# Patient Record
Sex: Male | Born: 1955 | Race: White | Hispanic: No | Marital: Single | Smoking: Former smoker
Health system: Southern US, Community
[De-identification: ages and names within clinical notes are randomized; demographics above are authoritative.]

## PROBLEM LIST (undated history)

## (undated) DIAGNOSIS — I639 Cerebral infarction, unspecified: Secondary | ICD-10-CM

## (undated) DIAGNOSIS — I10 Essential (primary) hypertension: Secondary | ICD-10-CM

## (undated) DIAGNOSIS — I447 Left bundle-branch block, unspecified: Secondary | ICD-10-CM

## (undated) DIAGNOSIS — I509 Heart failure, unspecified: Secondary | ICD-10-CM

## (undated) DIAGNOSIS — E785 Hyperlipidemia, unspecified: Secondary | ICD-10-CM

## (undated) DIAGNOSIS — E119 Type 2 diabetes mellitus without complications: Secondary | ICD-10-CM

## (undated) DIAGNOSIS — J984 Other disorders of lung: Secondary | ICD-10-CM

## (undated) DIAGNOSIS — I2510 Atherosclerotic heart disease of native coronary artery without angina pectoris: Secondary | ICD-10-CM

## (undated) DIAGNOSIS — E1140 Type 2 diabetes mellitus with diabetic neuropathy, unspecified: Secondary | ICD-10-CM

## (undated) DIAGNOSIS — Z86711 Personal history of pulmonary embolism: Secondary | ICD-10-CM

## (undated) DIAGNOSIS — J449 Chronic obstructive pulmonary disease, unspecified: Secondary | ICD-10-CM

## (undated) DIAGNOSIS — I272 Pulmonary hypertension, unspecified: Secondary | ICD-10-CM

## (undated) DIAGNOSIS — N400 Enlarged prostate without lower urinary tract symptoms: Secondary | ICD-10-CM

## (undated) HISTORY — DX: Left bundle-branch block, unspecified: I44.7

## (undated) HISTORY — DX: Hyperlipidemia, unspecified: E78.5

## (undated) HISTORY — DX: Benign prostatic hyperplasia without lower urinary tract symptoms: N40.0

## (undated) HISTORY — DX: Type 2 diabetes mellitus with diabetic neuropathy, unspecified: E11.40

## (undated) HISTORY — DX: Personal history of pulmonary embolism: Z86.711

## (undated) HISTORY — DX: Heart failure, unspecified: I50.9

## (undated) HISTORY — PX: OTHER SURGICAL HISTORY: SHX169

## (undated) HISTORY — DX: Type 2 diabetes mellitus without complications: E11.9

## (undated) HISTORY — DX: Atherosclerotic heart disease of native coronary artery without angina pectoris: I25.10

## (undated) HISTORY — DX: Chronic obstructive pulmonary disease, unspecified: J44.9

## (undated) HISTORY — DX: Cerebral infarction, unspecified: I63.9

## (undated) HISTORY — DX: Pulmonary hypertension, unspecified: I27.20

## (undated) HISTORY — DX: Other disorders of lung: J98.4

## (undated) HISTORY — DX: Essential (primary) hypertension: I10

---

## 2011-12-16 DIAGNOSIS — Z86711 Personal history of pulmonary embolism: Secondary | ICD-10-CM

## 2011-12-16 DIAGNOSIS — I2699 Other pulmonary embolism without acute cor pulmonale: Secondary | ICD-10-CM

## 2011-12-16 HISTORY — DX: Personal history of pulmonary embolism: Z86.711

## 2011-12-16 HISTORY — DX: Other pulmonary embolism without acute cor pulmonale: I26.99

## 2012-07-06 DIAGNOSIS — I2699 Other pulmonary embolism without acute cor pulmonale: Secondary | ICD-10-CM

## 2012-07-07 DIAGNOSIS — I2699 Other pulmonary embolism without acute cor pulmonale: Secondary | ICD-10-CM

## 2014-03-03 ENCOUNTER — Other Ambulatory Visit (HOSPITAL_COMMUNITY): Payer: Self-pay

## 2014-03-03 DIAGNOSIS — G4730 Sleep apnea, unspecified: Secondary | ICD-10-CM

## 2017-01-07 DIAGNOSIS — R972 Elevated prostate specific antigen [PSA]: Secondary | ICD-10-CM | POA: Diagnosis not present

## 2017-01-13 DIAGNOSIS — R972 Elevated prostate specific antigen [PSA]: Secondary | ICD-10-CM | POA: Diagnosis not present

## 2017-01-13 DIAGNOSIS — R3915 Urgency of urination: Secondary | ICD-10-CM | POA: Diagnosis not present

## 2017-04-17 DIAGNOSIS — R1013 Epigastric pain: Secondary | ICD-10-CM | POA: Diagnosis not present

## 2017-04-17 DIAGNOSIS — R0789 Other chest pain: Secondary | ICD-10-CM | POA: Diagnosis not present

## 2017-04-17 DIAGNOSIS — R05 Cough: Secondary | ICD-10-CM | POA: Diagnosis not present

## 2017-04-17 DIAGNOSIS — R0602 Shortness of breath: Secondary | ICD-10-CM | POA: Diagnosis not present

## 2017-05-14 DIAGNOSIS — I10 Essential (primary) hypertension: Secondary | ICD-10-CM | POA: Diagnosis not present

## 2017-05-14 DIAGNOSIS — J209 Acute bronchitis, unspecified: Secondary | ICD-10-CM | POA: Diagnosis not present

## 2017-06-15 DIAGNOSIS — R0602 Shortness of breath: Secondary | ICD-10-CM | POA: Diagnosis not present

## 2017-06-15 DIAGNOSIS — I10 Essential (primary) hypertension: Secondary | ICD-10-CM | POA: Diagnosis not present

## 2017-06-15 DIAGNOSIS — J209 Acute bronchitis, unspecified: Secondary | ICD-10-CM | POA: Diagnosis not present

## 2017-06-15 DIAGNOSIS — G4730 Sleep apnea, unspecified: Secondary | ICD-10-CM | POA: Diagnosis not present

## 2017-06-23 DIAGNOSIS — I10 Essential (primary) hypertension: Secondary | ICD-10-CM | POA: Diagnosis not present

## 2017-06-23 DIAGNOSIS — R931 Abnormal findings on diagnostic imaging of heart and coronary circulation: Secondary | ICD-10-CM | POA: Diagnosis not present

## 2017-06-23 DIAGNOSIS — R0602 Shortness of breath: Secondary | ICD-10-CM | POA: Diagnosis not present

## 2017-08-10 DIAGNOSIS — I10 Essential (primary) hypertension: Secondary | ICD-10-CM | POA: Diagnosis not present

## 2017-08-10 DIAGNOSIS — G4730 Sleep apnea, unspecified: Secondary | ICD-10-CM | POA: Diagnosis not present

## 2017-08-10 DIAGNOSIS — R739 Hyperglycemia, unspecified: Secondary | ICD-10-CM | POA: Diagnosis not present

## 2017-09-17 DIAGNOSIS — I10 Essential (primary) hypertension: Secondary | ICD-10-CM | POA: Diagnosis not present

## 2017-09-17 DIAGNOSIS — E1165 Type 2 diabetes mellitus with hyperglycemia: Secondary | ICD-10-CM | POA: Diagnosis not present

## 2017-09-17 DIAGNOSIS — G4730 Sleep apnea, unspecified: Secondary | ICD-10-CM | POA: Diagnosis not present

## 2017-11-17 DIAGNOSIS — E1165 Type 2 diabetes mellitus with hyperglycemia: Secondary | ICD-10-CM | POA: Diagnosis not present

## 2017-11-17 DIAGNOSIS — I10 Essential (primary) hypertension: Secondary | ICD-10-CM | POA: Diagnosis not present

## 2017-11-17 DIAGNOSIS — G4730 Sleep apnea, unspecified: Secondary | ICD-10-CM | POA: Diagnosis not present

## 2017-11-25 DIAGNOSIS — E1165 Type 2 diabetes mellitus with hyperglycemia: Secondary | ICD-10-CM | POA: Diagnosis not present

## 2017-11-25 DIAGNOSIS — I10 Essential (primary) hypertension: Secondary | ICD-10-CM | POA: Diagnosis not present

## 2017-11-25 DIAGNOSIS — G4730 Sleep apnea, unspecified: Secondary | ICD-10-CM | POA: Diagnosis not present

## 2017-11-25 DIAGNOSIS — Z23 Encounter for immunization: Secondary | ICD-10-CM | POA: Diagnosis not present

## 2018-01-26 DIAGNOSIS — J019 Acute sinusitis, unspecified: Secondary | ICD-10-CM | POA: Diagnosis not present

## 2018-01-26 DIAGNOSIS — J209 Acute bronchitis, unspecified: Secondary | ICD-10-CM | POA: Diagnosis not present

## 2018-02-10 DIAGNOSIS — S0181XA Laceration without foreign body of other part of head, initial encounter: Secondary | ICD-10-CM | POA: Diagnosis not present

## 2018-02-10 DIAGNOSIS — W1839XA Other fall on same level, initial encounter: Secondary | ICD-10-CM | POA: Diagnosis not present

## 2018-05-27 DIAGNOSIS — E1165 Type 2 diabetes mellitus with hyperglycemia: Secondary | ICD-10-CM | POA: Diagnosis not present

## 2018-05-27 DIAGNOSIS — R0683 Snoring: Secondary | ICD-10-CM | POA: Diagnosis not present

## 2018-05-27 DIAGNOSIS — I10 Essential (primary) hypertension: Secondary | ICD-10-CM | POA: Diagnosis not present

## 2018-05-27 DIAGNOSIS — Z1389 Encounter for screening for other disorder: Secondary | ICD-10-CM | POA: Diagnosis not present

## 2018-11-10 DIAGNOSIS — I10 Essential (primary) hypertension: Secondary | ICD-10-CM | POA: Diagnosis not present

## 2018-11-10 DIAGNOSIS — E875 Hyperkalemia: Secondary | ICD-10-CM | POA: Diagnosis not present

## 2018-11-10 DIAGNOSIS — E1165 Type 2 diabetes mellitus with hyperglycemia: Secondary | ICD-10-CM | POA: Diagnosis not present

## 2018-11-16 DIAGNOSIS — Z23 Encounter for immunization: Secondary | ICD-10-CM | POA: Diagnosis not present

## 2018-11-16 DIAGNOSIS — E1165 Type 2 diabetes mellitus with hyperglycemia: Secondary | ICD-10-CM | POA: Diagnosis not present

## 2018-11-16 DIAGNOSIS — N400 Enlarged prostate without lower urinary tract symptoms: Secondary | ICD-10-CM | POA: Diagnosis not present

## 2018-11-16 DIAGNOSIS — I10 Essential (primary) hypertension: Secondary | ICD-10-CM | POA: Diagnosis not present

## 2019-04-18 DIAGNOSIS — G4730 Sleep apnea, unspecified: Secondary | ICD-10-CM | POA: Diagnosis not present

## 2019-04-18 DIAGNOSIS — E1165 Type 2 diabetes mellitus with hyperglycemia: Secondary | ICD-10-CM | POA: Diagnosis not present

## 2019-04-18 DIAGNOSIS — I10 Essential (primary) hypertension: Secondary | ICD-10-CM | POA: Diagnosis not present

## 2019-04-18 DIAGNOSIS — E782 Mixed hyperlipidemia: Secondary | ICD-10-CM | POA: Diagnosis not present

## 2019-04-29 DIAGNOSIS — J449 Chronic obstructive pulmonary disease, unspecified: Secondary | ICD-10-CM | POA: Diagnosis not present

## 2019-04-29 DIAGNOSIS — R0602 Shortness of breath: Secondary | ICD-10-CM | POA: Diagnosis not present

## 2019-04-29 DIAGNOSIS — E1165 Type 2 diabetes mellitus with hyperglycemia: Secondary | ICD-10-CM | POA: Diagnosis not present

## 2019-04-29 DIAGNOSIS — I10 Essential (primary) hypertension: Secondary | ICD-10-CM | POA: Diagnosis not present

## 2021-10-07 ENCOUNTER — Encounter: Payer: Self-pay | Admitting: *Deleted

## 2021-10-08 ENCOUNTER — Encounter: Payer: Self-pay | Admitting: Cardiology

## 2021-10-08 NOTE — Progress Notes (Signed)
Cardiology Office Note  Date: 10/09/2021   ID: Warren, Jon 1956/05/05, MRN [id number]  PCP:  Royann Shivers, PA-C  Cardiologist:  Nona Dell, MD Electrophysiologist:  None   Chief Complaint  Patient presents with   Shortness of breath and fatigue     History of Present Illness: Jon Warren is a 65 y.o. male referred for cardiology consultation by Ms. Skillman PA-C with [facility] for evaluation of shortness of breath based on available information.  He tells me that he has felt a relative lack of stamina and shortness of breath over the last year.  He works as a Curator at [employer].  Particularly when he bends over or squats down he feels short of breath as well.  Intermittent leg swelling noted, no orthopnea or PND.  No exertional chest tightness or palpitations.  He was evaluated in the past by [facility] cardiology, saw Dr. Molly Maduro back in 2016 with left bundle branch block and negative ischemic work-up at that point.  Also mention of chronic diastolic heart failure.  His last echocardiogram was in 2013 as noted below, LVEF 76% at that point.  ECG today shows sinus rhythm with old left bundle branch block.  I reviewed his medications which are outlined below.  He reports compliance with therapy.  Also went over recent lab work from September.  Past Medical History:  Diagnosis Date   BPH (benign prostatic hyperplasia)    History of pulmonary embolus (PE) 2013   Hyperlipidemia    Hypertension    LBBB (left bundle branch block)    Stroke (HCC)    Type 2 diabetes mellitus (HCC)     Past Surgical History:  Procedure Laterality Date   No prior surgeries      Current Outpatient Medications  Medication Sig Dispense Refill   aspirin 325 MG tablet Take by mouth daily.     carvedilol (COREG) 6.25 MG tablet Take 6.25 mg by mouth 2 (two) times daily with a meal.     Fluticasone-Umeclidin-Vilant (TRELEGY ELLIPTA) 100-62.5-25 MCG/ACT AEPB Inhale 1 puff into  the lungs daily.     gabapentin (NEURONTIN) 300 MG capsule Take 300 mg by mouth daily.     hydrochlorothiazide (HYDRODIURIL) 25 MG tablet Take 25 mg by mouth daily.     insulin glargine (LANTUS SOLOSTAR) 100 UNIT/ML Solostar Pen Inject 56 Units into the skin 2 (two) times daily.     lisinopril (ZESTRIL) 40 MG tablet Take 40 mg by mouth daily.     metFORMIN (GLUCOPHAGE-XR) 500 MG 24 hr tablet Take 2,000 mg by mouth daily with breakfast.     tamsulosin (FLOMAX) 0.4 MG CAPS capsule Take 0.4 mg by mouth daily.     No current facility-administered medications for this visit.   Allergies:  Patient has no known allergies.   Social History: The patient  reports that he has quit smoking. His smoking use included cigarettes. He has never used smokeless tobacco. He reports current alcohol use. He reports that he does not use drugs.   Family History: The patient's family history includes Hypertension in his mother.   ROS: No syncope.  Physical Exam: VS:  BP 140/70   Pulse 72   Ht 5\' 6"  (1.676 m)   Wt 258 lb (117 kg)   SpO2 96%   BMI 41.64 kg/m , BMI Body mass index is 41.64 kg/m.  Wt Readings from Last 3 Encounters:  10/09/21 258 lb (117 kg)    General: Patient appears comfortable  at rest. HEENT: Conjunctiva and lids normal, wearing a mask. Neck: Supple, no elevated JVP or carotid bruits, no thyromegaly. Lungs: Clear to auscultation, nonlabored breathing at rest. Cardiac: Regular rate and rhythm, no S3, 1/6 systolic murmur, no pericardial rub. Abdomen: Soft, nontender, bowel sounds present. Extremities: Mild ankle edema, distal pulses 2+. Skin: Warm and dry. Musculoskeletal: No kyphosis. Neuropsychiatric: Alert and oriented x3, affect grossly appropriate.  ECG:  An ECG dated 10/09/2021 was personally reviewed today and demonstrated:  Sinus rhythm with left bundle branch block.  Recent Labwork:  September 2022: BUN 11, creatinine 0.95, potassium 4.3, AST 17, ALT 17, hemoglobin 13.7,  platelets 421, cholesterol 157, triglycerides 111, HDL 33, LDL 104, hemoglobin A1c 6.8%  Other Studies Reviewed Today:  Echocardiogram 11/15/2012 ([facility]): LEFT VENTRICLE  The left ventricular size is normal. LV ejection fraction = 76%. Left  ventricular systolic function is normal. Left ventricular filling pattern is  normal. The left ventricular wall motion is normal.   RIGHT VENTRICLE  The right ventricle is normal size. The right ventricular systolic function  is normal.   LEFT ATRIUM  The left atrium is mildly dilated.   RIGHT ATRIUM   Right atrial size is normal. Injection of agitated saline showed no  right-to-left shunt.   AORTIC VALVE  The aortic valve is normal in structure and function. No aortic regurgitation  is present.   MITRAL VALVE  The mitral valve is normal in structure and function.  TRICUSPID VALVE  The tricuspid valve is normal in structure and function.   PULMONIC VALVE  The pulmonic valve is normal in structure and function. There is no pulmonic  valvular regurgitation.   ARTERIES  The aortic root is normal size.   VENOUS  IVC size was normal.   EFFUSION  There is no pericardial effusion.   Assessment and Plan:  1.  Dyspnea on exertion and fatigue, also bendopnea.  He has a history of diastolic heart failure based on review of old records, no recent cardiac structural or ischemic testing however.  He has type 2 diabetes mellitus and hypertension at baseline as well.  Left bundle branch block is chronic.  He is presently on Coreg, lisinopril, and HCTZ, no more potent diuretic use.  Plan to obtain an echocardiogram and a Lexiscan Myoview and then make adjustments from there.  2.  Type 2 diabetes mellitus, currently on Lantus and Glucophage XR.  Recent hemoglobin A1c 6.8%.  3.  Essential hypertension, on Coreg, lisinopril, and HCTZ.  4.  LDL 104, would also consider statin use given presence of diabetes.  Medication  Adjustments/Labs and Tests Ordered: Current medicines are reviewed at length with the patient today.  Concerns regarding medicines are outlined above.   Tests Ordered: Orders Placed This Encounter  Procedures   NM Myocar Multi W/Spect W/Wall Motion / EF   EKG 12-Lead   ECHOCARDIOGRAM COMPLETE     Medication Changes: No orders of the defined types were placed in this encounter.   Disposition:  Follow up  test results.  Signed, Jonelle Sidle, MD, [facility] 10/09/2021 9:27 AM    [facility] at [facility] [address] [place], [place], [place] [address] Phone: [phone]; Fax: [phone]

## 2021-10-09 ENCOUNTER — Encounter: Payer: Self-pay | Admitting: *Deleted

## 2021-10-09 ENCOUNTER — Encounter (INDEPENDENT_AMBULATORY_CARE_PROVIDER_SITE_OTHER): Payer: Self-pay

## 2021-10-09 ENCOUNTER — Encounter: Payer: Self-pay | Admitting: Cardiology

## 2021-10-09 ENCOUNTER — Ambulatory Visit (INDEPENDENT_AMBULATORY_CARE_PROVIDER_SITE_OTHER): Payer: 59 | Admitting: Cardiology

## 2021-10-09 ENCOUNTER — Telehealth: Payer: Self-pay | Admitting: Cardiology

## 2021-10-09 ENCOUNTER — Other Ambulatory Visit: Payer: Self-pay

## 2021-10-09 VITALS — BP 140/70 | HR 72 | Ht 66.0 in | Wt 258.0 lb

## 2021-10-09 DIAGNOSIS — R0609 Other forms of dyspnea: Secondary | ICD-10-CM | POA: Diagnosis not present

## 2021-10-09 DIAGNOSIS — I10 Essential (primary) hypertension: Secondary | ICD-10-CM | POA: Diagnosis not present

## 2021-10-09 DIAGNOSIS — R0602 Shortness of breath: Secondary | ICD-10-CM

## 2021-10-09 DIAGNOSIS — I447 Left bundle-branch block, unspecified: Secondary | ICD-10-CM | POA: Diagnosis not present

## 2021-10-09 DIAGNOSIS — Z794 Long term (current) use of insulin: Secondary | ICD-10-CM

## 2021-10-09 DIAGNOSIS — R9431 Abnormal electrocardiogram [ECG] [EKG]: Secondary | ICD-10-CM

## 2021-10-09 DIAGNOSIS — E119 Type 2 diabetes mellitus without complications: Secondary | ICD-10-CM

## 2021-10-09 NOTE — Patient Instructions (Signed)
Medication Instructions:  Your physician recommends that you continue on your current medications as directed. Please refer to the Current Medication list given to you today.  Labwork: none  Testing/Procedures: Your physician has requested that you have an echocardiogram. Echocardiography is a painless test that uses sound waves to create images of your heart. It provides your doctor with information about the size and shape of your heart and how well your heart's chambers and valves are working. This procedure takes approximately one hour. There are no restrictions for this procedure. Your physician has requested that you have a lexiscan myoview. For further information please visit [email or web address]. Please follow instruction sheet, as given.  Follow-Up: Your physician recommends that you schedule a follow-up appointment in: pending  Any Other Special Instructions Will Be Listed Below (If Applicable).  If you need a refill on your cardiac medications before your next appointment, please call your pharmacy. 

## 2021-10-09 NOTE — Telephone Encounter (Signed)
Checking percert on the following patient for testing scheduled at [facility].     LEXISCAN    10/16/2021   ECHO - 11/05/2021 AT [facility]

## 2021-10-16 ENCOUNTER — Encounter (HOSPITAL_COMMUNITY): Payer: Self-pay

## 2021-10-16 ENCOUNTER — Other Ambulatory Visit: Payer: Self-pay

## 2021-10-16 ENCOUNTER — Ambulatory Visit (HOSPITAL_COMMUNITY)
Admission: RE | Admit: 2021-10-16 | Discharge: 2021-10-16 | Disposition: A | Discharge status: Home / Self Care | Payer: 59 | Source: Ambulatory Visit | Attending: Cardiology | Admitting: Cardiology

## 2021-10-16 DIAGNOSIS — R9431 Abnormal electrocardiogram [ECG] [EKG]: Secondary | ICD-10-CM | POA: Insufficient documentation

## 2021-10-16 DIAGNOSIS — R0609 Other forms of dyspnea: Secondary | ICD-10-CM | POA: Diagnosis present

## 2021-10-16 LAB — NM MYOCAR MULTI W/SPECT W/WALL MOTION / EF
LV dias vol: 100 mL (ref 62–150)
LV sys vol: 27 mL
Nuc Stress EF: 73 %
Peak HR: 96 {beats}/min
RATE: 0.4
Rest HR: 74 {beats}/min
Rest Nuclear Isotope Dose: 10.3 mCi
SDS: 3
SRS: 2
SSS: 5
ST Depression (mm): 0 mm
Stress Nuclear Isotope Dose: 32.5 mCi
TID: 0.95

## 2021-10-16 MED ORDER — REGADENOSON 0.4 MG/5ML IV SOLN
INTRAVENOUS | Status: AC
  Administered 2021-10-16: 0.4 mg via INTRAVENOUS
  Filled 2021-10-16: qty 5

## 2021-10-16 MED ORDER — TECHNETIUM TC 99M TETROFOSMIN IV KIT
30.0000 | PACK | Freq: Once | INTRAVENOUS | Status: AC | PRN
  Administered 2021-10-16: 32.5 via INTRAVENOUS

## 2021-10-16 MED ORDER — SODIUM CHLORIDE FLUSH 0.9 % IV SOLN
INTRAVENOUS | Status: AC
  Administered 2021-10-16: 10 mL via INTRAVENOUS
  Filled 2021-10-16: qty 10

## 2021-10-16 MED ORDER — TECHNETIUM TC 99M TETROFOSMIN IV KIT
10.0000 | PACK | Freq: Once | INTRAVENOUS | Status: AC | PRN
  Administered 2021-10-16: 10.3 via INTRAVENOUS

## 2021-10-17 ENCOUNTER — Telehealth: Payer: Self-pay | Admitting: *Deleted

## 2021-10-17 NOTE — Telephone Encounter (Signed)
-----   Message from Jonelle Sidle, MD sent at 10/16/2021  9:06 PM EDT ----- Results reviewed. Stress test was low risk, no clear evidence of obstructive CAD which is reassuring. Await echocardiogram results as well.

## 2021-10-18 NOTE — Telephone Encounter (Addendum)
Patient informed. 

## 2021-10-18 NOTE — Telephone Encounter (Signed)
Mychart msg sent Copy sent to PCP 

## 2021-10-18 NOTE — Telephone Encounter (Signed)
Pt is returning call.  

## 2021-11-05 ENCOUNTER — Other Ambulatory Visit: Payer: Self-pay | Admitting: *Deleted

## 2021-11-05 ENCOUNTER — Telehealth: Payer: Self-pay | Admitting: *Deleted

## 2021-11-05 ENCOUNTER — Ambulatory Visit (INDEPENDENT_AMBULATORY_CARE_PROVIDER_SITE_OTHER): Payer: 59

## 2021-11-05 DIAGNOSIS — R9431 Abnormal electrocardiogram [ECG] [EKG]: Secondary | ICD-10-CM | POA: Diagnosis not present

## 2021-11-05 DIAGNOSIS — R0602 Shortness of breath: Secondary | ICD-10-CM

## 2021-11-05 DIAGNOSIS — I10 Essential (primary) hypertension: Secondary | ICD-10-CM

## 2021-11-05 DIAGNOSIS — Z79899 Other long term (current) drug therapy: Secondary | ICD-10-CM

## 2021-11-05 DIAGNOSIS — R0609 Other forms of dyspnea: Secondary | ICD-10-CM

## 2021-11-05 LAB — ECHOCARDIOGRAM COMPLETE
Area-P 1/2: 3.6 cm2
S' Lateral: 3.78 cm
Single Plane A4C EF: 61.4 %

## 2021-11-05 MED ORDER — FUROSEMIDE 20 MG PO TABS: 20.0000 mg | ORAL_TABLET | Freq: Every day | ORAL | 3 refills | Status: DC

## 2021-11-05 MED ORDER — POTASSIUM CHLORIDE ER 10 MEQ PO TBCR: 10.0000 meq | EXTENDED_RELEASE_TABLET | Freq: Every day | ORAL | 3 refills | Status: DC

## 2021-11-05 NOTE — Telephone Encounter (Signed)
-----   Message from Jonelle Sidle, MD sent at 11/05/2021 12:28 PM EST ----- Results reviewed.  He continues to have vigorous LVEF, 65 to 70%.  Estimated RV systolic pressure is mildly elevated at 44 mmHg.  RV contraction normal.  Interval Myoview was also low risk without significant ischemia.  Let's plan to have him stop HCTZ and replace it with Lasix 20 mg daily, KCl 10 mEq daily.  Schedule follow-up in the next 6 weeks to see how he is doing.  Check BMET in 2 weeks.

## 2021-11-05 NOTE — Telephone Encounter (Signed)
Patient informed and verbalized understanding of plan. Lab order faxed to [facility] 

## 2021-11-22 ENCOUNTER — Telehealth: Payer: Self-pay | Admitting: Cardiology

## 2021-11-22 NOTE — Telephone Encounter (Signed)
Advised that per phone conversation on 11/05/21-bmet in 2 weeks at [facility]. Advised that no appointment is needed and lab order faxed already. Says he will go Monday.

## 2021-11-22 NOTE — Telephone Encounter (Signed)
Pt was advised to go to [facility] for [place], he wants to know when he should go and who is supposed to set up the appt

## 2021-11-28 ENCOUNTER — Telehealth: Payer: Self-pay | Admitting: *Deleted

## 2021-11-28 NOTE — Telephone Encounter (Signed)
-----   Message from Jonelle Sidle, MD sent at 11/26/2021  2:27 PM EST ----- Results reviewed.  Renal function and potassium normal range.  Continue with current medications and follow-up plan.

## 2021-11-29 ENCOUNTER — Telehealth: Payer: Self-pay | Admitting: *Deleted

## 2021-11-29 MED ORDER — HYDROCHLOROTHIAZIDE 25 MG PO TABS: 25.0000 mg | ORAL_TABLET | Freq: Every day | ORAL | 3 refills | Status: AC

## 2021-11-29 NOTE — Telephone Encounter (Signed)
Patient informed and verbalized understanding of plan. 

## 2021-11-29 NOTE — Telephone Encounter (Signed)
Reports 2 weeks after starting lasix 20 mg & potassium 10 meq daily, SOB seemed to be worse than when taking hctz Advised that message would be routed to provider

## 2021-11-29 NOTE — Telephone Encounter (Signed)
Patient informed. Copy sent to PCP °

## 2021-12-19 NOTE — H&P (View-Only) (Signed)
Cardiology Office Note  Date: 12/20/2021   ID: Jon Warren, Jon Warren 1956-04-10, MRN [id number]  PCP:  Jon Shivers, PA-C  Cardiologist:  Nona Dell, MD Electrophysiologist:  None   Chief Complaint  Patient presents with   Cardiac follow-up    History of Present Illness: Jon Warren is a 66 y.o. male last seen in October 2022.  He presents for a follow-up visit. Cardiac testing from November 2022 is noted below including echocardiogram and Lexiscan Myoview.  He had no evidence of ischemia and vigorous LVEF.  Moderate LVH was present with RVSP approximately 44 mmHg.  We did attempt to switch from HCTZ to Lasix, however he states that he felt more short of breath on the Lasix.  Presents reporting NYHA class II-III dyspnea on exertion, also bendopnea.  No exertional chest pain or syncope.  Medications are otherwise stable as noted below.  We had him ambulate in the office 6 minutes on pulse oximeter, saturation dropped to 88%.  At rest his oxygen saturation is normal and he is not tachycardic.  I personally reviewed his ECG today which shows sinus rhythm with old left bundle branch block.  Past Medical History:  Diagnosis Date   BPH (benign prostatic hyperplasia)    History of pulmonary embolus (PE) 2013   Hyperlipidemia    Hypertension    LBBB (left bundle branch block)    Stroke (HCC)    Type 2 diabetes mellitus (HCC)     Past Surgical History:  Procedure Laterality Date   No prior surgeries      Current Outpatient Medications  Medication Sig Dispense Refill   aspirin 325 MG tablet Take by mouth daily.     carvedilol (COREG) 6.25 MG tablet Take 6.25 mg by mouth 2 (two) times daily with a meal.     Fluticasone-Umeclidin-Vilant (TRELEGY ELLIPTA) 100-62.5-25 MCG/ACT AEPB Inhale 1 puff into the lungs daily.     gabapentin (NEURONTIN) 300 MG capsule Take 300 mg by mouth daily.     hydrochlorothiazide (HYDRODIURIL) 25 MG tablet Take 1 tablet (25 mg total) by  mouth daily. 90 tablet 3   insulin glargine (LANTUS SOLOSTAR) 100 UNIT/ML Solostar Pen Inject 56 Units into the skin 2 (two) times daily.     lisinopril (ZESTRIL) 40 MG tablet Take 40 mg by mouth daily.     metFORMIN (GLUCOPHAGE-XR) 500 MG 24 hr tablet Take 2,000 mg by mouth daily with breakfast.     tamsulosin (FLOMAX) 0.4 MG CAPS capsule Take 0.4 mg by mouth daily.     No current facility-administered medications for this visit.   Allergies:  Patient has no known allergies.   Social History: The patient  reports that he has quit smoking. His smoking use included cigarettes. He has never used smokeless tobacco. He reports current alcohol use. He reports that he does not use drugs.   Family History: The patient's family history includes Hypertension in his mother.   ROS: No palpitations or syncope.  No cough or hemoptysis.  Physical Exam: VS:  BP 130/80 (BP Location: Left Arm, Patient Position: Sitting, Cuff Size: Large)    Pulse (!) 110    Ht 5\' 6"  (1.676 m)    Wt 255 lb 12.8 oz (116 kg)    SpO2 (!) 88% Comment: 1000 while walking 6 minutes   BMI 41.29 kg/m , BMI Body mass index is 41.29 kg/m.  Wt Readings from Last 3 Encounters:  12/20/21 255 lb 12.8 oz (116 kg)  10/09/21 258 lb (117 kg)  °  °General: Patient appears comfortable at rest. °HEENT: Conjunctiva and lids normal, wearing a mask. °Neck: Supple, no elevated JVP or carotid bruits, no thyromegaly. °Lungs: Clear to auscultation, nonlabored breathing at rest. °Cardiac: Regular rate and rhythm, no S3, 1/6 systolic murmur, no pericardial rub. °Abdomen: Soft, nontender, no hepatomegaly, bowel sounds present, no guarding or rebound. °Extremities: Trace ankle edema, distal pulses 2+. °Skin: Warm and dry. °Musculoskeletal: No kyphosis. °Neuropsychiatric: Alert and oriented x3, affect grossly appropriate. ° °ECG:  An ECG dated 10/09/2021 was personally reviewed today and demonstrated:  Sinus rhythm with left bundle-branch block. ° °Recent  Labwork: ° °September 2022: BUN 11, creatinine 0.95, potassium 4.3, AST 17, ALT 17, hemoglobin 13.7, platelets 421, cholesterol 157, triglycerides 111, HDL 33, LDL 104, hemoglobin A1c 6.8% °December 2022: Potassium 4.4, BUN 10, creatinine 1.1 ° °Other Studies Reviewed Today: ° °Echocardiogram 11/05/2021: ° 1. Left ventricular ejection fraction, by estimation, is 65 to 70%. The  °left ventricle has normal function. The left ventricle has no regional  °wall motion abnormalities. There is moderate left ventricular hypertrophy.  °Left ventricular diastolic  °parameters were normal.  ° 2. Right ventricular systolic function is normal. The right ventricular  °size is normal. There is mildly elevated pulmonary artery systolic  °pressure. The estimated right ventricular systolic pressure is 43.7 mmHg.  ° 3. Right atrial size was mildly dilated.  ° 4. The mitral valve is grossly normal. No evidence of mitral valve  °regurgitation.  ° 5. The aortic valve is tricuspid. There is mild calcification of the  °aortic valve. Aortic valve regurgitation is not visualized.  ° 6. The inferior vena cava is normal in size with greater than 50%  °respiratory variability, suggesting right atrial pressure of 3 mmHg.  ° °Lexiscan Myoview 10/16/2021: °  The study is normal. The study is low risk. °  No ST deviation was noted. °  LV perfusion is normal. There is no evidence of ischemia. There is no evidence of infarction. °  Left ventricular function is normal. Nuclear stress EF: 73 %. The left ventricular ejection fraction is hyperdynamic (>65%). End diastolic cavity size is normal. °  °Mildly reduced counts in the apex on stress and rest imaging with normal wall motion consistent with apical thinning artifact.  °Normal study without evidence of ischemia or infarction.  °Normal LVEF, >73%. °This is a low-risk study.  ° °Assessment and Plan: ° °1.  Dyspnea on exertion, NYHA class II-III, also ambulatory hypoxia by pulse oximetry and 6-minute  walk.  Recent Lexiscan Myoview showed no definite ischemia.  Echocardiogram demonstrated vigorous LVEF with moderate LVH and reportedly normal diastolic parameters.  RV contraction normal with estimated RVSP 44 mmHg.  He was switched from HCTZ to Lasix given possibility of pulmonary venous hypertension, however felt worse in terms of his shortness of breath.  He does have a history of pulmonary embolus back in 2013.  Situation discussed with Dr. Bensimhon.  Plan at this time is to schedule a right and left heart catheterization to definitively exclude ischemic heart disease and also better assess hemodynamics and possibility of pulmonary arterial hypertension (further work-up can proceed from there).  We will get a chest x-ray and PFTs as well.  For now continue with present regimen. ° °2.  Essential hypertension, on Coreg, lisinopril, and HCTZ.  Systolic is 130 today. ° °3.  Type 2 diabetes mellitus, presently on Glucophage and Lantus with follow-up by PCP. ° °Medication Adjustments/Labs and Tests Ordered: °  Current medicines are reviewed at length with the patient today.  Concerns regarding medicines are outlined above.   Tests Ordered: Orders Placed This Encounter  Procedures   DG Chest 2 View   CBC   Basic metabolic panel   EKG 12-Lead   Pulmonary Function Test    Medication Changes: No orders of the defined types were placed in this encounter.   Disposition:  Follow up  6 weeks.  Signed, Jonelle Sidle, MD, [facility] [place] [facility] 12/20/2021 10:44 AM    [facility] at [facility] [address] [place], [place], [place] [address] Phone: [phone]; Fax: [phone]

## 2021-12-19 NOTE — Progress Notes (Signed)
Cardiology Office Note  Date: 12/20/2021   ID: Alanson, Hausmann 1956-04-10, MRN [id number]  PCP:  Royann Shivers, PA-C  Cardiologist:  Nona Dell, MD Electrophysiologist:  None   Chief Complaint  Patient presents with   Cardiac follow-up    History of Present Illness: Jon Warren is a 66 y.o. male last seen in October 2022.  He presents for a follow-up visit. Cardiac testing from November 2022 is noted below including echocardiogram and Lexiscan Myoview.  He had no evidence of ischemia and vigorous LVEF.  Moderate LVH was present with RVSP approximately 44 mmHg.  We did attempt to switch from HCTZ to Lasix, however he states that he felt more short of breath on the Lasix.  Presents reporting NYHA class II-III dyspnea on exertion, also bendopnea.  No exertional chest pain or syncope.  Medications are otherwise stable as noted below.  We had him ambulate in the office 6 minutes on pulse oximeter, saturation dropped to 88%.  At rest his oxygen saturation is normal and he is not tachycardic.  I personally reviewed his ECG today which shows sinus rhythm with old left bundle branch block.  Past Medical History:  Diagnosis Date   BPH (benign prostatic hyperplasia)    History of pulmonary embolus (PE) 2013   Hyperlipidemia    Hypertension    LBBB (left bundle branch block)    Stroke (HCC)    Type 2 diabetes mellitus (HCC)     Past Surgical History:  Procedure Laterality Date   No prior surgeries      Current Outpatient Medications  Medication Sig Dispense Refill   aspirin 325 MG tablet Take by mouth daily.     carvedilol (COREG) 6.25 MG tablet Take 6.25 mg by mouth 2 (two) times daily with a meal.     Fluticasone-Umeclidin-Vilant (TRELEGY ELLIPTA) 100-62.5-25 MCG/ACT AEPB Inhale 1 puff into the lungs daily.     gabapentin (NEURONTIN) 300 MG capsule Take 300 mg by mouth daily.     hydrochlorothiazide (HYDRODIURIL) 25 MG tablet Take 1 tablet (25 mg total) by  mouth daily. 90 tablet 3   insulin glargine (LANTUS SOLOSTAR) 100 UNIT/ML Solostar Pen Inject 56 Units into the skin 2 (two) times daily.     lisinopril (ZESTRIL) 40 MG tablet Take 40 mg by mouth daily.     metFORMIN (GLUCOPHAGE-XR) 500 MG 24 hr tablet Take 2,000 mg by mouth daily with breakfast.     tamsulosin (FLOMAX) 0.4 MG CAPS capsule Take 0.4 mg by mouth daily.     No current facility-administered medications for this visit.   Allergies:  Patient has no known allergies.   Social History: The patient  reports that he has quit smoking. His smoking use included cigarettes. He has never used smokeless tobacco. He reports current alcohol use. He reports that he does not use drugs.   Family History: The patient's family history includes Hypertension in his mother.   ROS: No palpitations or syncope.  No cough or hemoptysis.  Physical Exam: VS:  BP 130/80 (BP Location: Left Arm, Patient Position: Sitting, Cuff Size: Large)    Pulse (!) 110    Ht 5\' 6"  (1.676 m)    Wt 255 lb 12.8 oz (116 kg)    SpO2 (!) 88% Comment: 1000 while walking 6 minutes   BMI 41.29 kg/m , BMI Body mass index is 41.29 kg/m.  Wt Readings from Last 3 Encounters:  12/20/21 255 lb 12.8 oz (116 kg)  10/09/21 258 lb (117 kg)    General: Patient appears comfortable at rest. HEENT: Conjunctiva and lids normal, wearing a mask. Neck: Supple, no elevated JVP or carotid bruits, no thyromegaly. Lungs: Clear to auscultation, nonlabored breathing at rest. Cardiac: Regular rate and rhythm, no S3, 1/6 systolic murmur, no pericardial rub. Abdomen: Soft, nontender, no hepatomegaly, bowel sounds present, no guarding or rebound. Extremities: Trace ankle edema, distal pulses 2+. Skin: Warm and dry. Musculoskeletal: No kyphosis. Neuropsychiatric: Alert and oriented x3, affect grossly appropriate.  ECG:  An ECG dated 10/09/2021 was personally reviewed today and demonstrated:  Sinus rhythm with left bundle-branch block.  Recent  Labwork:  September 2022: BUN 11, creatinine 0.95, potassium 4.3, AST 17, ALT 17, hemoglobin 13.7, platelets 421, cholesterol 157, triglycerides 111, HDL 33, LDL 104, hemoglobin A1c 6.8% December 2022: Potassium 4.4, BUN 10, creatinine 1.1  Other Studies Reviewed Today:  Echocardiogram 11/05/2021:  1. Left ventricular ejection fraction, by estimation, is 65 to 70%. The  left ventricle has normal function. The left ventricle has no regional  wall motion abnormalities. There is moderate left ventricular hypertrophy.  Left ventricular diastolic  parameters were normal.   2. Right ventricular systolic function is normal. The right ventricular  size is normal. There is mildly elevated pulmonary artery systolic  pressure. The estimated right ventricular systolic pressure is 43.7 mmHg.   3. Right atrial size was mildly dilated.   4. The mitral valve is grossly normal. No evidence of mitral valve  regurgitation.   5. The aortic valve is tricuspid. There is mild calcification of the  aortic valve. Aortic valve regurgitation is not visualized.   6. The inferior vena cava is normal in size with greater than 50%  respiratory variability, suggesting right atrial pressure of 3 mmHg.   Lexiscan Myoview 10/16/2021:   The study is normal. The study is low risk.   No ST deviation was noted.   LV perfusion is normal. There is no evidence of ischemia. There is no evidence of infarction.   Left ventricular function is normal. Nuclear stress EF: 73 %. The left ventricular ejection fraction is hyperdynamic (>65%). End diastolic cavity size is normal.   Mildly reduced counts in the apex on stress and rest imaging with normal wall motion consistent with apical thinning artifact.  Normal study without evidence of ischemia or infarction.  Normal LVEF, >73%. This is a low-risk study.   Assessment and Plan:  1.  Dyspnea on exertion, NYHA class II-III, also ambulatory hypoxia by pulse oximetry and 6-minute  walk.  Recent Lexiscan Myoview showed no definite ischemia.  Echocardiogram demonstrated vigorous LVEF with moderate LVH and reportedly normal diastolic parameters.  RV contraction normal with estimated RVSP 44 mmHg.  He was switched from HCTZ to Lasix given possibility of pulmonary venous hypertension, however felt worse in terms of his shortness of breath.  He does have a history of pulmonary embolus back in 2013.  Situation discussed with Dr. Gala RomneyBensimhon.  Plan at this time is to schedule a right and left heart catheterization to definitively exclude ischemic heart disease and also better assess hemodynamics and possibility of pulmonary arterial hypertension (further work-up can proceed from there).  We will get a chest x-ray and PFTs as well.  For now continue with present regimen.  2.  Essential hypertension, on Coreg, lisinopril, and HCTZ.  Systolic is 130 today.  3.  Type 2 diabetes mellitus, presently on Glucophage and Lantus with follow-up by PCP.  Medication Adjustments/Labs and Tests Ordered:  Current medicines are reviewed at length with the patient today.  Concerns regarding medicines are outlined above.   Tests Ordered: Orders Placed This Encounter  Procedures   DG Chest 2 View   CBC   Basic metabolic panel   EKG 12-Lead   Pulmonary Function Test    Medication Changes: No orders of the defined types were placed in this encounter.   Disposition:  Follow up  6 weeks.  Signed, Jonelle Sidle, MD, [facility] [place] [facility] 12/20/2021 10:44 AM    [facility] at [facility] [address] [place], [place], [place] [address] Phone: [phone]; Fax: [phone]

## 2021-12-20 ENCOUNTER — Ambulatory Visit: Payer: 59 | Admitting: Cardiology

## 2021-12-20 ENCOUNTER — Other Ambulatory Visit: Payer: Self-pay

## 2021-12-20 ENCOUNTER — Ambulatory Visit (HOSPITAL_COMMUNITY)
Admission: RE | Admit: 2021-12-20 | Discharge: 2021-12-20 | Disposition: A | Discharge status: Home / Self Care | Payer: 59 | Source: Ambulatory Visit | Attending: Cardiology | Admitting: Cardiology

## 2021-12-20 ENCOUNTER — Other Ambulatory Visit: Payer: Self-pay | Admitting: Cardiology

## 2021-12-20 ENCOUNTER — Telehealth: Payer: Self-pay | Admitting: Cardiology

## 2021-12-20 ENCOUNTER — Other Ambulatory Visit (HOSPITAL_COMMUNITY)
Admission: RE | Admit: 2021-12-20 | Discharge: 2021-12-20 | Disposition: A | Discharge status: Home / Self Care | Payer: 59 | Source: Ambulatory Visit | Attending: Cardiology | Admitting: Cardiology

## 2021-12-20 ENCOUNTER — Encounter: Payer: Self-pay | Admitting: Cardiology

## 2021-12-20 VITALS — BP 130/80 | HR 110 | Ht 66.0 in | Wt 255.8 lb

## 2021-12-20 DIAGNOSIS — Z01810 Encounter for preprocedural cardiovascular examination: Secondary | ICD-10-CM | POA: Insufficient documentation

## 2021-12-20 DIAGNOSIS — R0602 Shortness of breath: Secondary | ICD-10-CM

## 2021-12-20 DIAGNOSIS — I272 Pulmonary hypertension, unspecified: Secondary | ICD-10-CM

## 2021-12-20 DIAGNOSIS — Z01818 Encounter for other preprocedural examination: Secondary | ICD-10-CM

## 2021-12-20 DIAGNOSIS — R0902 Hypoxemia: Secondary | ICD-10-CM

## 2021-12-20 DIAGNOSIS — Z794 Long term (current) use of insulin: Secondary | ICD-10-CM

## 2021-12-20 DIAGNOSIS — E119 Type 2 diabetes mellitus without complications: Secondary | ICD-10-CM

## 2021-12-20 DIAGNOSIS — R0609 Other forms of dyspnea: Secondary | ICD-10-CM

## 2021-12-20 DIAGNOSIS — Z86711 Personal history of pulmonary embolism: Secondary | ICD-10-CM

## 2021-12-20 LAB — BASIC METABOLIC PANEL
Anion gap: 12 (ref 5–15)
BUN: 15 mg/dL (ref 8–23)
CO2: 28 mmol/L (ref 22–32)
Calcium: 9 mg/dL (ref 8.9–10.3)
Chloride: 98 mmol/L (ref 98–111)
Creatinine, Ser: 0.93 mg/dL (ref 0.61–1.24)
GFR, Estimated: >60 mL/min (ref >60)
Glucose, Bld: 107 mg/dL — ABNORMAL HIGH (ref 70–99)
Potassium: 3.8 mmol/L (ref 3.5–5.1)
Sodium: 138 mmol/L (ref 135–145)

## 2021-12-20 LAB — CBC
HCT: 42.5 % (ref 39.0–52.0)
Hemoglobin: 13.6 g/dL (ref 13.0–17.0)
MCH: 29.1 pg (ref 26.0–34.0)
MCHC: 32 g/dL (ref 30.0–36.0)
MCV: 90.8 fL (ref 80.0–100.0)
Platelets: 392 10*3/uL (ref 150–400)
RBC: 4.68 MIL/uL (ref 4.22–5.81)
RDW: 14.9 % (ref 11.5–15.5)
WBC: 14 10*3/uL — ABNORMAL HIGH (ref 4.0–10.5)
nRBC: 0 % (ref 0.0–0.2)

## 2021-12-20 MED ORDER — SODIUM CHLORIDE 0.9% FLUSH: 3.0000 mL | Freq: Two times a day (BID) | INTRAVENOUS | Status: AC

## 2021-12-20 NOTE — Patient Instructions (Addendum)
Medication Instructions:  Your physician recommends that you continue on your current medications as directed. Please refer to the Current Medication list given to you today.  Labwork: BMET & CBC today at [facility]  Testing/Procedures: A chest x-ray takes a picture of the organs and structures inside the chest, including the heart, lungs, and blood vessels. This test can show several things, including, whether the heart is enlarges; whether fluid is building up in the lungs; and whether pacemaker / defibrillator leads are still in place. Your physician has requested that you have a cardiac catheterization. Cardiac catheterization is used to diagnose and/or treat various heart conditions. Doctors may recommend this procedure for a number of different reasons. The most common reason is to evaluate chest pain. Chest pain can be a symptom of coronary artery disease (CAD), and cardiac catheterization can show whether plaque is narrowing or blocking your hearts arteries. This procedure is also used to evaluate the valves, as well as measure the blood flow and oxygen levels in different parts of your heart. For further information please visit [email or web address] Please follow instruction sheet, as given. Your physician has recommended that you have a pulmonary function test. Pulmonary Function Tests are a group of tests that measure how well air moves in and out of your lungs.  Follow-Up: Your physician recommends that you schedule a follow-up appointment in: 6 weeks  Any Other Special Instructions Will Be Listed Below (If Applicable).  If you need a refill on your cardiac medications before your next appointment, please call your pharmacy.   [facility] CARDIOVASCULAR DIV[facility] [place] [address] [place] [address] Dept: [phone] Loc: [phone]  Jayvier Burgher Yeates  12/20/2021  You are scheduled for a Cardiac  Catheterization on Monday, January 9 with Dr. Arvilla Meres.  1. Please arrive at the [facility] (Main Entrance A) at [facility]: [address] [place], [place] [address] at 9:30 AM (This time is two hours before your procedure to ensure your preparation). Free valet parking service is available.   Special note: Every effort is made to have your procedure done on time. Please understand that emergencies sometimes delay scheduled procedures.  2. Diet: Do not eat solid foods after midnight.  The patient may have clear liquids until 5am upon the day of the procedure.  3. Labs: You will need to have blood drawn on Friday, January 6 at [facility] You do not need to be fasting.  4. Medication instructions in preparation for your procedure: Hold morning dose of hydrochlorothiazide, metformin and lantus morning doses day of cath. Take half dose of bedtime lantus dose (28 units). Hold metformin 48 hours after cath   Contrast Allergy: No  Stop taking, HTCZ (Hydrochlorothiazide) Monday, January 9, (hold dose before cath)  Take only 28 units of insulin the night before your procedure. Do not take any insulin on the day of the procedure.  Do not take Diabetes Med Glucophage (Metformin) on the day of the procedure and HOLD 48 HOURS AFTER THE PROCEDURE.  On the morning of your procedure, take your Aspirin and any morning medicines NOT listed above.  You may use sips of water.  5. Plan for one night stay--bring personal belongings. 6. Bring a current list of your medications and current insurance cards. 7. You MUST have a responsible person to drive you home. 8. Someone MUST be with you the first 24 hours after you  arrive home or your discharge will be delayed. 9. Please wear clothes that are easy to get on and off and wear slip-on shoes.  Thank you for allowing [place] to care for you!   -- [facility]

## 2021-12-20 NOTE — Telephone Encounter (Signed)
PERCERT:  Right and Left heart cath dx: DOE & Pulmonary HTN Monday, 12/23/21 @11 :30 am with Dr. 

## 2021-12-23 ENCOUNTER — Encounter (HOSPITAL_COMMUNITY)
Admission: RE | Disposition: A | Discharge status: Home / Self Care | Payer: Self-pay | Source: Home / Self Care | Attending: Internal Medicine

## 2021-12-23 ENCOUNTER — Ambulatory Visit (HOSPITAL_COMMUNITY)
Admission: RE | Admit: 2021-12-23 | Discharge: 2021-12-23 | Disposition: A | Discharge status: Home / Self Care | Payer: 59 | Attending: Internal Medicine | Admitting: Internal Medicine

## 2021-12-23 DIAGNOSIS — I2721 Secondary pulmonary arterial hypertension: Secondary | ICD-10-CM | POA: Insufficient documentation

## 2021-12-23 DIAGNOSIS — I272 Pulmonary hypertension, unspecified: Secondary | ICD-10-CM

## 2021-12-23 DIAGNOSIS — E119 Type 2 diabetes mellitus without complications: Secondary | ICD-10-CM | POA: Diagnosis not present

## 2021-12-23 DIAGNOSIS — I2510 Atherosclerotic heart disease of native coronary artery without angina pectoris: Secondary | ICD-10-CM

## 2021-12-23 DIAGNOSIS — Z794 Long term (current) use of insulin: Secondary | ICD-10-CM | POA: Diagnosis not present

## 2021-12-23 DIAGNOSIS — R0609 Other forms of dyspnea: Secondary | ICD-10-CM | POA: Insufficient documentation

## 2021-12-23 DIAGNOSIS — Z79899 Other long term (current) drug therapy: Secondary | ICD-10-CM | POA: Diagnosis not present

## 2021-12-23 DIAGNOSIS — I10 Essential (primary) hypertension: Secondary | ICD-10-CM | POA: Diagnosis not present

## 2021-12-23 HISTORY — PX: RIGHT/LEFT HEART CATH AND CORONARY ANGIOGRAPHY: CATH118266

## 2021-12-23 LAB — POCT I-STAT EG7
Acid-Base Excess: 0 mmol/L (ref 0.0–2.0)
Acid-Base Excess: 2 mmol/L (ref 0.0–2.0)
Acid-base deficit: 3 mmol/L — ABNORMAL HIGH (ref 0.0–2.0)
Bicarbonate: 23.4 mmol/L (ref 20.0–28.0)
Bicarbonate: 26.7 mmol/L (ref 20.0–28.0)
Bicarbonate: 29.4 mmol/L — ABNORMAL HIGH (ref 20.0–28.0)
Calcium, Ion: 0.79 mmol/L — CL (ref 1.15–1.40)
Calcium, Ion: 0.97 mmol/L — ABNORMAL LOW (ref 1.15–1.40)
Calcium, Ion: 1.14 mmol/L — ABNORMAL LOW (ref 1.15–1.40)
HCT: 33 % — ABNORMAL LOW (ref 39.0–52.0)
HCT: 35 % — ABNORMAL LOW (ref 39.0–52.0)
HCT: 39 % (ref 39.0–52.0)
Hemoglobin: 11.2 g/dL — ABNORMAL LOW (ref 13.0–17.0)
Hemoglobin: 11.9 g/dL — ABNORMAL LOW (ref 13.0–17.0)
Hemoglobin: 13.3 g/dL (ref 13.0–17.0)
O2 Saturation: 70 %
O2 Saturation: 72 %
O2 Saturation: 73 %
Potassium: 2.6 mmol/L — CL (ref 3.5–5.1)
Potassium: 3 mmol/L — ABNORMAL LOW (ref 3.5–5.1)
Potassium: 3.5 mmol/L (ref 3.5–5.1)
Sodium: 141 mmol/L (ref 135–145)
Sodium: 142 mmol/L (ref 135–145)
Sodium: 144 mmol/L (ref 135–145)
TCO2: 25 mmol/L (ref 22–32)
TCO2: 28 mmol/L (ref 22–32)
TCO2: 31 mmol/L (ref 22–32)
pCO2, Ven: 47.7 mmHg (ref 44.0–60.0)
pCO2, Ven: 50.4 mmHg (ref 44.0–60.0)
pCO2, Ven: 56 mmHg (ref 44.0–60.0)
pH, Ven: 7.3 (ref 7.250–7.430)
pH, Ven: 7.329 (ref 7.250–7.430)
pH, Ven: 7.332 (ref 7.250–7.430)
pO2, Ven: 41 mmHg (ref 32.0–45.0)
pO2, Ven: 41 mmHg (ref 32.0–45.0)
pO2, Ven: 42 mmHg (ref 32.0–45.0)

## 2021-12-23 LAB — GLUCOSE, CAPILLARY
Glucose-Capillary: 85 mg/dL (ref 70–99)
Glucose-Capillary: 78 mg/dL (ref 70–99)

## 2021-12-23 LAB — POCT I-STAT 7, (LYTES, BLD GAS, ICA,H+H)
Acid-Base Excess: 2 mmol/L (ref 0.0–2.0)
Bicarbonate: 28.3 mmol/L — ABNORMAL HIGH (ref 20.0–28.0)
Calcium, Ion: 1.11 mmol/L — ABNORMAL LOW (ref 1.15–1.40)
HCT: 38 % — ABNORMAL LOW (ref 39.0–52.0)
Hemoglobin: 12.9 g/dL — ABNORMAL LOW (ref 13.0–17.0)
O2 Saturation: 95 %
Potassium: 3.5 mmol/L (ref 3.5–5.1)
Sodium: 142 mmol/L (ref 135–145)
TCO2: 30 mmol/L (ref 22–32)
pCO2 arterial: 52.4 mmHg — ABNORMAL HIGH (ref 32.0–48.0)
pH, Arterial: 7.341 — ABNORMAL LOW (ref 7.350–7.450)
pO2, Arterial: 79 mmHg — ABNORMAL LOW (ref 83.0–108.0)

## 2021-12-23 SURGERY — RIGHT/LEFT HEART CATH AND CORONARY ANGIOGRAPHY
Anesthesia: LOCAL

## 2021-12-23 MED ORDER — SODIUM CHLORIDE 0.9 % IV SOLN: INTRAVENOUS | Status: DC

## 2021-12-23 MED ORDER — SODIUM CHLORIDE 0.9% FLUSH: 3.0000 mL | INTRAVENOUS | Status: DC | PRN

## 2021-12-23 MED ORDER — SODIUM CHLORIDE 0.9 % WEIGHT BASED INFUSION
3.0000 mL/kg/h | INTRAVENOUS | Status: AC
  Administered 2021-12-23: 3 mL/kg/h via INTRAVENOUS

## 2021-12-23 MED ORDER — NITROGLYCERIN 1 MG/10 ML FOR IR/CATH LAB
INTRA_ARTERIAL | Status: AC
  Filled 2021-12-23: qty 10

## 2021-12-23 MED ORDER — HEPARIN SODIUM (PORCINE) 1000 UNIT/ML IJ SOLN
INTRAMUSCULAR | Status: AC
  Filled 2021-12-23: qty 10

## 2021-12-23 MED ORDER — FENTANYL CITRATE (PF) 100 MCG/2ML IJ SOLN
INTRAMUSCULAR | Status: AC
  Filled 2021-12-23: qty 2

## 2021-12-23 MED ORDER — SODIUM CHLORIDE 0.9 % WEIGHT BASED INFUSION: 1.0000 mL/kg/h | INTRAVENOUS | Status: DC

## 2021-12-23 MED ORDER — MIDAZOLAM HCL 2 MG/2ML IJ SOLN
INTRAMUSCULAR | Status: DC | PRN
  Administered 2021-12-23: 1 mg via INTRAVENOUS
  Administered 2021-12-23: 2 mg via INTRAVENOUS

## 2021-12-23 MED ORDER — SODIUM CHLORIDE 0.9 % IV SOLN: 250.0000 mL | INTRAVENOUS | Status: DC | PRN

## 2021-12-23 MED ORDER — IOHEXOL 350 MG/ML SOLN
INTRAVENOUS | Status: DC | PRN
  Administered 2021-12-23: 90 mL

## 2021-12-23 MED ORDER — ACETAMINOPHEN 325 MG PO TABS: 650.0000 mg | ORAL_TABLET | ORAL | Status: DC | PRN

## 2021-12-23 MED ORDER — ASPIRIN 81 MG PO CHEW: 81.0000 mg | CHEWABLE_TABLET | ORAL | Status: DC

## 2021-12-23 MED ORDER — LIDOCAINE HCL (PF) 1 % IJ SOLN
INTRAMUSCULAR | Status: DC | PRN
  Administered 2021-12-23 (×2): 2 mL

## 2021-12-23 MED ORDER — HYDRALAZINE HCL 20 MG/ML IJ SOLN: 10.0000 mg | INTRAMUSCULAR | Status: DC | PRN

## 2021-12-23 MED ORDER — LABETALOL HCL 5 MG/ML IV SOLN: 10.0000 mg | INTRAVENOUS | Status: DC | PRN

## 2021-12-23 MED ORDER — FENTANYL CITRATE (PF) 100 MCG/2ML IJ SOLN
INTRAMUSCULAR | Status: DC | PRN
  Administered 2021-12-23: 25 ug via INTRAVENOUS

## 2021-12-23 MED ORDER — SODIUM CHLORIDE 0.9% FLUSH: 3.0000 mL | Freq: Two times a day (BID) | INTRAVENOUS | Status: DC

## 2021-12-23 MED ORDER — MIDAZOLAM HCL 2 MG/2ML IJ SOLN
INTRAMUSCULAR | Status: AC
  Filled 2021-12-23: qty 2

## 2021-12-23 MED ORDER — ONDANSETRON HCL 4 MG/2ML IJ SOLN: 4.0000 mg | Freq: Four times a day (QID) | INTRAMUSCULAR | Status: DC | PRN

## 2021-12-23 MED ORDER — VERAPAMIL HCL 2.5 MG/ML IV SOLN
INTRAVENOUS | Status: AC
  Filled 2021-12-23: qty 2

## 2021-12-23 MED ORDER — HEPARIN (PORCINE) IN NACL 1000-0.9 UT/500ML-% IV SOLN
INTRAVENOUS | Status: DC | PRN
  Administered 2021-12-23 (×2): 500 mL

## 2021-12-23 MED ORDER — VERAPAMIL HCL 2.5 MG/ML IV SOLN: INTRAVENOUS | Status: DC | PRN

## 2021-12-23 MED ORDER — LIDOCAINE HCL (PF) 1 % IJ SOLN
INTRAMUSCULAR | Status: AC
  Filled 2021-12-23: qty 30

## 2021-12-23 MED ORDER — HEPARIN SODIUM (PORCINE) 1000 UNIT/ML IJ SOLN
INTRAMUSCULAR | Status: DC | PRN
  Administered 2021-12-23: 5000 [IU] via INTRAVENOUS

## 2021-12-23 MED ORDER — HEPARIN (PORCINE) IN NACL 1000-0.9 UT/500ML-% IV SOLN
INTRAVENOUS | Status: AC
  Filled 2021-12-23: qty 1000

## 2021-12-23 SURGICAL SUPPLY — 15 items
CATH BALLN WEDGE 5F 110CM (CATHETERS) ×1 IMPLANT
CATH INFINITI 5 FR 3DRC (CATHETERS) ×1 IMPLANT
CATH INFINITI 5 FR JL3.5 (CATHETERS) ×1 IMPLANT
CATH INFINITI 5FR AL1 (CATHETERS) ×1 IMPLANT
CATH INFINITI JR4 5F (CATHETERS) ×1 IMPLANT
DEVICE RAD COMP TR BAND LRG (VASCULAR PRODUCTS) ×1 IMPLANT
GLIDESHEATH SLEND SS 6F .021 (SHEATH) ×1 IMPLANT
GUIDEWIRE INQWIRE 1.5J.035X260 (WIRE) IMPLANT
INQWIRE 1.5J .035X260CM (WIRE) ×2
KIT HEART LEFT (KITS) ×1 IMPLANT
KIT MICROPUNCTURE NIT STIFF (SHEATH) ×1 IMPLANT
PACK CARDIAC CATHETERIZATION (CUSTOM PROCEDURE TRAY) ×2 IMPLANT
SHEATH GLIDE SLENDER 4/5FR (SHEATH) ×1 IMPLANT
SHEATH PROBE COVER 6X72 (BAG) ×1 IMPLANT
TRANSDUCER W/STOPCOCK (MISCELLANEOUS) ×2 IMPLANT

## 2021-12-23 NOTE — Interval H&P Note (Signed)
History and Physical Interval Note:  12/23/2021 1:19 PM  Jon Warren  has presented today for surgery, with the diagnosis of hp.  The various methods of treatment have been discussed with the patient and family. After consideration of risks, benefits and other options for treatment, the patient has consented to  Procedure(s): RIGHT/LEFT HEART CATH AND CORONARY ANGIOGRAPHY (N/A) and possible coronary angioplasty as a surgical intervention.  The patient's history has been reviewed, patient examined, no change in status, stable for surgery.  I have reviewed the patient's chart and labs.  Questions were answered to the patient's satisfaction.     Jarita Raval

## 2021-12-24 ENCOUNTER — Encounter (HOSPITAL_COMMUNITY): Payer: Self-pay | Admitting: Internal Medicine

## 2021-12-25 ENCOUNTER — Telehealth: Payer: Self-pay | Admitting: *Deleted

## 2021-12-25 NOTE — Telephone Encounter (Signed)
-----   Message from Jonelle Sidle, MD sent at 12/20/2021  3:48 PM EST ----- Results reviewed.  Chest x-ray without acute findings.

## 2021-12-26 NOTE — Telephone Encounter (Signed)
Mychart message sent. Copy sent to PCP 

## 2022-01-24 ENCOUNTER — Other Ambulatory Visit (HOSPITAL_COMMUNITY)
Admission: RE | Admit: 2022-01-24 | Discharge: 2022-01-24 | Disposition: A | Discharge status: Home / Self Care | Payer: 59 | Source: Ambulatory Visit | Attending: Cardiology | Admitting: Cardiology

## 2022-01-24 DIAGNOSIS — Z01818 Encounter for other preprocedural examination: Secondary | ICD-10-CM

## 2022-01-28 ENCOUNTER — Ambulatory Visit (HOSPITAL_COMMUNITY): Payer: 59

## 2022-01-28 ENCOUNTER — Ambulatory Visit: Payer: 59 | Admitting: Cardiology

## 2022-01-29 NOTE — Progress Notes (Signed)
Cardiology Office Note  Date: 01/30/2022   ID: Warren, Jon [birth year], MRN [id number]  PCP:  Royann Shivers, PA-C  Cardiologist:  Nona Dell, MD Electrophysiologist:  None   Chief Complaint  Patient presents with   Cardiac follow-up    History of Present Illness: Jon Warren is a 66 y.o. male last seen in January and referred to Dr. Gala Romney for further work-up of dyspnea on exertion and pulmonary hypertension.  Results of right and left heart catheterization are noted below.  He presents today for a routine visit.  Does not report any angina.  States that he has been pacing himself, checks his oxygen saturations periodically with his home pulse oximeter, typically in the mid 90s at rest.  Chest x-ray showed no acute process.  He awaits PFTs and 6-minute walk as well as referral for OSA evaluation.  We discussed the results of his cardiac catheterization.  Also plan to start statin therapy.  His last LDL was only 104 but new goal should be 55 or less at this point.  Past Medical History:  Diagnosis Date   BPH (benign prostatic hyperplasia)    History of pulmonary embolus (PE) 2013   Hyperlipidemia    Hypertension    LBBB (left bundle branch block)    Stroke (HCC)    Type 2 diabetes mellitus (HCC)     Past Surgical History:  Procedure Laterality Date   No prior surgeries     RIGHT/LEFT HEART CATH AND CORONARY ANGIOGRAPHY N/A 12/23/2021   Procedure: RIGHT/LEFT HEART CATH AND CORONARY ANGIOGRAPHY;  Surgeon: Dolores Patty, MD;  Location: [facility] INVASIVE CV LAB;  Service: Cardiovascular;  Laterality: N/A;    Current Outpatient Medications  Medication Sig Dispense Refill   aspirin 325 MG tablet Take 325 mg by mouth daily.     carvedilol (COREG) 6.25 MG tablet Take 6.25 mg by mouth 2 (two) times daily with a meal.     Fluticasone-Umeclidin-Vilant (TRELEGY ELLIPTA) 100-62.5-25 MCG/ACT AEPB Inhale 1 puff into the lungs daily.     gabapentin (NEURONTIN)  300 MG capsule Take 300 mg by mouth daily.     hydrochlorothiazide (HYDRODIURIL) 25 MG tablet Take 1 tablet (25 mg total) by mouth daily. 90 tablet 3   insulin glargine (LANTUS SOLOSTAR) 100 UNIT/ML Solostar Pen Inject 56 Units into the skin 2 (two) times daily.     lisinopril (ZESTRIL) 40 MG tablet Take 40 mg by mouth daily.     metFORMIN (GLUCOPHAGE-XR) 500 MG 24 hr tablet Take 500 mg by mouth daily with breakfast.     Multiple Vitamins-Minerals (MULTIVITAMIN WITH MINERALS) tablet Take 1 tablet by mouth daily.     rosuvastatin (CRESTOR) 10 MG tablet Take 1 tablet (10 mg total) by mouth daily. 90 tablet 3   tamsulosin (FLOMAX) 0.4 MG CAPS capsule Take 0.4 mg by mouth daily.     Current Facility-Administered Medications  Medication Dose Route Frequency Provider Last Rate Last Admin   sodium chloride flush (NS) 0.9 % injection 3 mL  3 mL Intravenous Q12H Jonelle Sidle, MD       Allergies:  Patient has no known allergies.   ROS: No palpitations or syncope.  Physical Exam: VS:  BP 124/68    Pulse 72    Ht 5\' 6"  (1.676 m)    Wt 253 lb 3.2 oz (114.9 kg)    SpO2 92%    BMI 40.87 kg/m , BMI Body mass index is 40.87 kg/m.  Wt Readings from Last 3 Encounters:  01/30/22 253 lb 3.2 oz (114.9 kg)  12/23/21 250 lb (113.4 kg)  12/20/21 255 lb 12.8 oz (116 kg)    General: Patient appears comfortable at rest. HEENT: Conjunctiva and lids normal, wearing a mask. Neck: Supple, no elevated JVP or carotid bruits, no thyromegaly. Lungs: Clear to auscultation, nonlabored breathing at rest. Cardiac: Regular rate and rhythm, no S3, 1/6 systolic murmur, no pericardial rub. Extremities: Trace ankle edema.  ECG:  An ECG dated 12/20/2021 was personally reviewed today and demonstrated:  Sinus rhythm with left bundle branch block.  Recent Labwork: 12/20/2021: BUN 15; Creatinine, Ser 0.93; Platelets 392 12/23/2021: Hemoglobin 11.9; Potassium 3.0; Sodium 142   Other Studies Reviewed Today:  Echocardiogram  11/05/2021:  1. Left ventricular ejection fraction, by estimation, is 65 to 70%. The  left ventricle has normal function. The left ventricle has no regional  wall motion abnormalities. There is moderate left ventricular hypertrophy.  Left ventricular diastolic  parameters were normal.   2. Right ventricular systolic function is normal. The right ventricular  size is normal. There is mildly elevated pulmonary artery systolic  pressure. The estimated right ventricular systolic pressure is 43.7 mmHg.   3. Right atrial size was mildly dilated.   4. The mitral valve is grossly normal. No evidence of mitral valve  regurgitation.   5. The aortic valve is tricuspid. There is mild calcification of the  aortic valve. Aortic valve regurgitation is not visualized.   6. The inferior vena cava is normal in size with greater than 50%  respiratory variability, suggesting right atrial pressure of 3 mmHg.   Chest x-ray 12/20/2021: FINDINGS: Stable cardiomediastinal silhouette. Stable deformity of right ribs which may be posttraumatic in etiology. Stable probable right midlung scarring. No definite acute abnormality is noted.   IMPRESSION: No definite acute cardiopulmonary disease.  Right and left heart catheterization 12/23/2021:   Prox RCA to Mid RCA lesion is 100% stenosed.   1st Mrg lesion is 70% stenosed.   Prox LAD to Mid LAD lesion is 50% stenosed.   The left ventricular ejection fraction is 55-65% by visual estimate.   Findings:   Ao = 113/63 (83) LV = 127/13 RA = 9 RV = 65/12 PA = 65/26 (42) PCW = 17 Fick cardiac output/index = 6.6/3.0 PVR = 3.8 WU Ao sat = 95% PA sat = 70%, 71% SVC sat = 73%   Assessment: 1. 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 LVEF 60-65% 3. 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 Will need  sleep study, weight loss and supplemental O2 if exertional sats < 88%.  Assessment and Plan:  1.  Pulmonary arterial hypertension, PA systolic in the 60s at recent right heart catheterization with wedge pressure 17 and PVR 3.8 Woods units.  At this point WHO group 3 suspected, rule out group 4 with prior history of pulmonary embolus.  He is scheduled for PFTs, 6-minute walk, and we will also refer him to [facility] in [place] for OSA evaluation.  Follow-up scheduled in the heart failure clinic as well.  2.  CAD, overall moderate in the major epicardial system with occluded small nondominant RCA.  LVEF is normal.  Continue aspirin, Coreg, lisinopril, and start Crestor.  3.  LDL 104, starting Crestor with goal LDL 55 or less.  4.  Type 2 diabetes mellitus, currently on Glucophage XR and insulin.  Consider SGLT2 inhibitor with vascular disease.  Medication Adjustments/Labs and Tests Ordered: Current medicines are reviewed at length with the patient today.  Concerns regarding medicines are outlined above.   Tests Ordered: Orders Placed This Encounter  Procedures   Lipid panel   AMB referral to CHF clinic   Ambulatory referral to Pulmonology    Medication Changes: Meds ordered this encounter  Medications   rosuvastatin (CRESTOR) 10 MG tablet    Sig: Take 1 tablet (10 mg total) by mouth daily.    Dispense:  90 tablet    Refill:  3    01/30/2022 NEW    Disposition:  Follow up  heart failure clinic and 6 months here.  Signed, Jonelle Sidle, MD, [facility] 01/30/2022 11:07 AM    [facility] at [facility] [address] [place], [place], [place] [address] Phone: [phone]; Fax: [phone]

## 2022-01-30 ENCOUNTER — Ambulatory Visit: Payer: 59 | Admitting: Cardiology

## 2022-01-30 ENCOUNTER — Encounter: Payer: Self-pay | Admitting: Cardiology

## 2022-01-30 VITALS — BP 124/68 | HR 72 | Ht 66.0 in | Wt 253.2 lb

## 2022-01-30 DIAGNOSIS — I272 Pulmonary hypertension, unspecified: Secondary | ICD-10-CM | POA: Diagnosis not present

## 2022-01-30 DIAGNOSIS — R29818 Other symptoms and signs involving the nervous system: Secondary | ICD-10-CM | POA: Diagnosis not present

## 2022-01-30 DIAGNOSIS — E782 Mixed hyperlipidemia: Secondary | ICD-10-CM

## 2022-01-30 DIAGNOSIS — I25119 Atherosclerotic heart disease of native coronary artery with unspecified angina pectoris: Secondary | ICD-10-CM | POA: Diagnosis not present

## 2022-01-30 DIAGNOSIS — E785 Hyperlipidemia, unspecified: Secondary | ICD-10-CM | POA: Insufficient documentation

## 2022-01-30 MED ORDER — ROSUVASTATIN CALCIUM 10 MG PO TABS: 10.0000 mg | ORAL_TABLET | Freq: Every day | ORAL | 3 refills | Status: DC

## 2022-01-30 NOTE — Patient Instructions (Addendum)
Medication Instructions:  Your physician has recommended you make the following change in your medication:  Start crestor 10 mg daily at bedtime Continue other medications the same  Labwork: Your physician recommends that you return for a FASTING lipid profile in 6 months just before your next visit. Please do not eat or drink for at least 8 hours when you have this done. You may take your medications that morning with a sip of water. [facility]-no appointment needed-7am-4pm Monday through Friday  Testing/Procedures: Please get an earlier appointment for your PFT's  Follow-Up: Your physician recommends that you schedule a follow-up appointment in: 6 months  Any Other Special Instructions Will Be Listed Below (If Applicable). You have been referred to Heart Failure Clinic You have been referred to [facility] Pulmonology  If you need a refill on your cardiac medications before your next appointment, please call your pharmacy.

## 2022-02-03 ENCOUNTER — Other Ambulatory Visit (HOSPITAL_COMMUNITY)
Admission: RE | Admit: 2022-02-03 | Discharge: 2022-02-03 | Disposition: A | Discharge status: Home / Self Care | Payer: 59 | Source: Ambulatory Visit | Attending: Cardiology | Admitting: Cardiology

## 2022-02-03 ENCOUNTER — Other Ambulatory Visit: Payer: Self-pay

## 2022-02-03 DIAGNOSIS — Z20822 Contact with and (suspected) exposure to covid-19: Secondary | ICD-10-CM | POA: Diagnosis not present

## 2022-02-03 DIAGNOSIS — Z01812 Encounter for preprocedural laboratory examination: Secondary | ICD-10-CM | POA: Diagnosis present

## 2022-02-03 DIAGNOSIS — Z01818 Encounter for other preprocedural examination: Secondary | ICD-10-CM

## 2022-02-04 LAB — SARS CORONAVIRUS 2 (TAT 6-24 HRS): SARS Coronavirus 2: NEGATIVE

## 2022-02-05 ENCOUNTER — Ambulatory Visit (HOSPITAL_COMMUNITY)
Admission: RE | Admit: 2022-02-05 | Discharge: 2022-02-05 | Disposition: A | Discharge status: Home / Self Care | Payer: 59 | Source: Ambulatory Visit | Attending: Cardiology | Admitting: Cardiology

## 2022-02-05 ENCOUNTER — Other Ambulatory Visit: Payer: Self-pay

## 2022-02-05 DIAGNOSIS — Z86711 Personal history of pulmonary embolism: Secondary | ICD-10-CM | POA: Insufficient documentation

## 2022-02-05 DIAGNOSIS — R0609 Other forms of dyspnea: Secondary | ICD-10-CM | POA: Insufficient documentation

## 2022-02-05 LAB — PULMONARY FUNCTION TEST
DL/VA % pred: 80 %
DL/VA: 3.38 ml/min/mmHg/L
DLCO unc % pred: 51 %
DLCO unc: 12.53 ml/min/mmHg
FEF 25-75 Post: 1.02 L/sec
FEF 25-75 Pre: 0.9 L/sec
FEF2575-%Change-Post: 13 %
FEF2575-%Pred-Post: 40 %
FEF2575-%Pred-Pre: 36 %
FEV1-%Change-Post: 2 %
FEV1-%Pred-Post: 51 %
FEV1-%Pred-Pre: 50 %
FEV1-Post: 1.6 L
FEV1-Pre: 1.56 L
FEV1FVC-%Change-Post: 3 %
FEV1FVC-%Pred-Pre: 91 %
FEV6-%Change-Post: 2 %
FEV6-%Pred-Post: 57 %
FEV6-%Pred-Pre: 56 %
FEV6-Post: 2.26 L
FEV6-Pre: 2.2 L
FEV6FVC-%Change-Post: 3 %
FEV6FVC-%Pred-Post: 105 %
FEV6FVC-%Pred-Pre: 101 %
FVC-%Change-Post: 0 %
FVC-%Pred-Post: 54 %
FVC-%Pred-Pre: 55 %
FVC-Post: 2.27 L
FVC-Pre: 2.28 L
Post FEV1/FVC ratio: 71 %
Post FEV6/FVC ratio: 100 %
Pre FEV1/FVC ratio: 68 %
Pre FEV6/FVC Ratio: 96 %
RV % pred: 54 %
RV: 1.18 L
TLC % pred: 54 %
TLC: 3.49 L

## 2022-02-05 MED ORDER — ALBUTEROL SULFATE (2.5 MG/3ML) 0.083% IN NEBU
2.5000 mg | INHALATION_SOLUTION | Freq: Once | RESPIRATORY_TRACT | Status: AC
  Administered 2022-02-05: 2.5 mg via RESPIRATORY_TRACT

## 2022-02-11 ENCOUNTER — Other Ambulatory Visit: Payer: Self-pay

## 2022-02-11 ENCOUNTER — Ambulatory Visit: Payer: 59 | Admitting: Pulmonary Disease

## 2022-02-11 ENCOUNTER — Encounter: Payer: Self-pay | Admitting: Pulmonary Disease

## 2022-02-11 VITALS — BP 138/76 | HR 70 | Temp 98.0°F | Ht 67.0 in | Wt 252.8 lb

## 2022-02-11 DIAGNOSIS — R0683 Snoring: Secondary | ICD-10-CM | POA: Diagnosis not present

## 2022-02-11 DIAGNOSIS — J439 Emphysema, unspecified: Secondary | ICD-10-CM | POA: Diagnosis not present

## 2022-02-11 DIAGNOSIS — E669 Obesity, unspecified: Secondary | ICD-10-CM | POA: Diagnosis not present

## 2022-02-11 DIAGNOSIS — J410 Simple chronic bronchitis: Secondary | ICD-10-CM

## 2022-02-11 DIAGNOSIS — J984 Other disorders of lung: Secondary | ICD-10-CM

## 2022-02-11 NOTE — Progress Notes (Signed)
[facility]  Chief Complaint  Patient presents with   Consult    Ref by cardiology Dr. Domenic Polite for suspected sleep apnea     Past Surgical History:  He  has a past surgical history that includes No prior surgeries and RIGHT/LEFT HEART CATH AND CORONARY ANGIOGRAPHY (N/A, 12/23/2021).  Past Medical History:  BPH, PE 2013, HLD, HTN, LBBB, CVA, DM type 2, Peripheral neuropathy, CAD  Constitutional:  BP 138/76 (BP Location: Left Arm, Patient Position: Sitting)    Pulse 70    Temp 98 F (36.7 C) (Temporal)    Ht 5\' 7"  (1.702 m)    Wt 252 lb 12.8 oz (114.7 kg)    SpO2 97% Comment: ra   BMI 39.59 kg/m   Brief Summary:  Jon Warren is a 66 y.o. male former smoker with snoring, dyspnea, COPD, and pulmonary hypertension.      Subjective:   He was seen recently by cardiology for dyspnea.  PFT showed mixed obstructive and restrictive defects.  Echo showed increased PA pressures.  [place] cath from 12/23/21 showed elevated PA pressures.  CXR from 12/20/21 right lung scarring and right rib deformities.  He denies being in any accidents or breaking his ribs.  He is from [place].  Works as a Sports coach.  Quit smoking 15 yrs ago, and smoked 2 ppd.  He had lung clot in 2013.  No history of pneumonia or exposure to TB.  Never in the [employer].  No family history of COPD.  His breathing is better since he started trelegy.  He has albuterol, but doesn't need to use this.  He is not having cough, wheeze, sputum, or chest congestion.  He is not able to keep up with routine activity without trouble.  Before starting trelegy he wouldn't be able to walk more than 50 feet without getting winded.  He gets short of breath still when bending over and when carrying heavy bags.  He gets ankle swelling in his feet at the end of the day.  He snores, and wakes up with a dry mouth.  He goes to bed at 10 pm.  He falls asleep quickly.  He gets up 2 or 3 times to use  the bathroom.  He gets out of bed at 4 am on work days, and 7 am when not working.  Feels okay in the morning.  Not using anything to help sleep or stay awake.  He denies sleep walking, sleep talking, bruxism, or nightmares.  There is no history of restless legs.  He denies sleep hallucinations, sleep paralysis, or cataplexy.  The Epworth score is 5 out of 24.  Ambulatory oximetry on room air today: walked 3 laps and maintained SpO2 > 92%.  Physical Exam:   Appearance - well kempt   ENMT - no sinus tenderness, no oral exudate, no LAN, Mallampati 4 airway, no stridor  Respiratory - equal breath sounds bilaterally, no wheezing or rales  CV - s1s2 regular rate and rhythm, no murmurs  Ext - no clubbing, no edema  Skin - no rashes  Psych - normal mood and affect   Pulmonary testing:  PFT 02/05/22 >> FEV1 1.60 (51%), FEV1% 71, TLC 3.49 (54%), DLCO 51%  Chest Imaging:    Sleep Tests:    Cardiac Tests:  Echo 11/05/21 >> EF 65 to 70%, mod LVH, RVSP 43.7 mmHg RHC 12/23/21 >> RA 9, RV 65/12, PA 65/26 (42), PCW 17, CI 3,  PVR 3.8 WU  Social History:  He  reports that he quit smoking about 16 years ago. His smoking use included cigarettes. He started smoking about 47 years ago. He smoked an average of 2 packs per day. He has never used smokeless tobacco. He reports current alcohol use. He reports that he does not use drugs.  Family History:  His family history includes Hypertension in his mother.    Discussion:  He has dyspnea on exertion.  His PFT showed restrictive and obstructive defect.  He reports improvement in symptoms after starting trelegy.    He has snoring, sleep disruption, apnea, and daytime sleepiness.  He has history of CAD, hypertension, and pulmonary hypertension.  His BMI is > 35.  I am concerned he could have obstructive sleep apnea.  Assessment/Plan:   Snoring with excessive daytime sleepiness. - will need to arrange for a home sleep study  COPD. - continue  trelegy - prn albuterol  Restrictive lung disease. 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 - in setting of COPD, diastolic dysfunction, and possible sleep disordered breathing  Coronary artery disease, Hypertension. - followed by Dr. Johnny Bridge with Cardiology  Obesity. - discussed how weight can impact sleep and risk for sleep disordered breathing - discussed options to assist with weight loss: combination of diet modification, cardiovascular and strength training exercises  Cardiovascular risk. - had an extensive discussion regarding the adverse health consequences related to untreated sleep disordered breathing - specifically discussed the risks for hypertension, coronary artery disease, cardiac dysrhythmias, cerebrovascular disease, and diabetes - lifestyle modification discussed  Safe driving practices. - discussed how sleep disruption can increase risk of accidents, particularly when driving - safe driving practices were discussed  Therapies for obstructive sleep apnea. - if the sleep study shows significant sleep apnea, then various therapies for treatment were reviewed: CPAP, oral appliance, and surgical interventions   Time Spent Involved in Patient Care on Day of Examination:  65 minutes  Follow up:   Patient Instructions  Will arrange for home sleep study Will call to arrange for follow up after sleep study reviewed   Medication List:   Allergies as of 02/11/2022   No Known Allergies      Medication List        Accurate as of February 11, 2022  9:28 AM. If you have any questions, ask your nurse or doctor.          aspirin 325 MG tablet Take 325 mg by mouth daily.   carvedilol 6.25 MG tablet Commonly known as: COREG Take 6.25 mg by mouth 2 (two) times daily with a meal.   gabapentin 300 MG capsule Commonly known as: NEURONTIN Take 300 mg by mouth daily.    hydrochlorothiazide 25 MG tablet Commonly known as: HYDRODIURIL Take 1 tablet (25 mg total) by mouth daily.   Lantus SoloStar 100 UNIT/ML Solostar Pen Generic drug: insulin glargine Inject 56 Units into the skin 2 (two) times daily.   lisinopril 40 MG tablet Commonly known as: ZESTRIL Take 40 mg by mouth daily.   metFORMIN 500 MG 24 hr tablet Commonly known as: GLUCOPHAGE-XR Take 500 mg by mouth daily with breakfast.   multivitamin with minerals tablet Take 1 tablet by mouth daily.   rosuvastatin 10 MG tablet Commonly known as: CRESTOR Take 1 tablet (10 mg total) by mouth daily.   tamsulosin 0.4 MG Caps capsule Commonly known as: FLOMAX Take 0.4 mg by  mouth daily.   Trelegy Ellipta 100-62.5-25 MCG/ACT Aepb Generic drug: Fluticasone-Umeclidin-Vilant Inhale 1 puff into the lungs daily.        Signature:  Chesley Mires, MD [place] Pager - [phone] 02/11/2022, 9:28 AM

## 2022-02-11 NOTE — Patient Instructions (Signed)
Will arrange for home sleep study Will call to arrange for follow up after sleep study reviewed  

## 2022-02-26 ENCOUNTER — Other Ambulatory Visit (HOSPITAL_COMMUNITY): Payer: Self-pay

## 2022-02-26 ENCOUNTER — Other Ambulatory Visit: Payer: Self-pay

## 2022-02-26 ENCOUNTER — Ambulatory Visit (HOSPITAL_COMMUNITY)
Admission: RE | Admit: 2022-02-26 | Discharge: 2022-02-26 | Disposition: A | Discharge status: Home / Self Care | Payer: 59 | Source: Ambulatory Visit | Attending: Internal Medicine | Admitting: Internal Medicine

## 2022-02-26 ENCOUNTER — Encounter (HOSPITAL_COMMUNITY): Payer: Self-pay | Admitting: Internal Medicine

## 2022-02-26 VITALS — BP 160/90 | HR 73 | Wt 254.4 lb

## 2022-02-26 DIAGNOSIS — M1990 Unspecified osteoarthritis, unspecified site: Secondary | ICD-10-CM | POA: Insufficient documentation

## 2022-02-26 DIAGNOSIS — I2721 Secondary pulmonary arterial hypertension: Secondary | ICD-10-CM | POA: Diagnosis present

## 2022-02-26 DIAGNOSIS — Z8249 Family history of ischemic heart disease and other diseases of the circulatory system: Secondary | ICD-10-CM | POA: Insufficient documentation

## 2022-02-26 DIAGNOSIS — I110 Hypertensive heart disease with heart failure: Secondary | ICD-10-CM | POA: Insufficient documentation

## 2022-02-26 DIAGNOSIS — Z7982 Long term (current) use of aspirin: Secondary | ICD-10-CM | POA: Insufficient documentation

## 2022-02-26 DIAGNOSIS — R29818 Other symptoms and signs involving the nervous system: Secondary | ICD-10-CM

## 2022-02-26 DIAGNOSIS — I272 Pulmonary hypertension, unspecified: Secondary | ICD-10-CM | POA: Diagnosis not present

## 2022-02-26 DIAGNOSIS — M7989 Other specified soft tissue disorders: Secondary | ICD-10-CM | POA: Diagnosis not present

## 2022-02-26 DIAGNOSIS — Z6839 Body mass index (BMI) 39.0-39.9, adult: Secondary | ICD-10-CM | POA: Diagnosis not present

## 2022-02-26 DIAGNOSIS — I491 Atrial premature depolarization: Secondary | ICD-10-CM | POA: Diagnosis not present

## 2022-02-26 DIAGNOSIS — I2510 Atherosclerotic heart disease of native coronary artery without angina pectoris: Secondary | ICD-10-CM | POA: Diagnosis not present

## 2022-02-26 DIAGNOSIS — M2550 Pain in unspecified joint: Secondary | ICD-10-CM | POA: Diagnosis not present

## 2022-02-26 DIAGNOSIS — J449 Chronic obstructive pulmonary disease, unspecified: Secondary | ICD-10-CM | POA: Diagnosis not present

## 2022-02-26 DIAGNOSIS — Z87891 Personal history of nicotine dependence: Secondary | ICD-10-CM | POA: Insufficient documentation

## 2022-02-26 DIAGNOSIS — Z7984 Long term (current) use of oral hypoglycemic drugs: Secondary | ICD-10-CM | POA: Diagnosis not present

## 2022-02-26 DIAGNOSIS — Z86711 Personal history of pulmonary embolism: Secondary | ICD-10-CM | POA: Diagnosis not present

## 2022-02-26 DIAGNOSIS — I447 Left bundle-branch block, unspecified: Secondary | ICD-10-CM | POA: Diagnosis not present

## 2022-02-26 DIAGNOSIS — E1140 Type 2 diabetes mellitus with diabetic neuropathy, unspecified: Secondary | ICD-10-CM | POA: Diagnosis not present

## 2022-02-26 DIAGNOSIS — R9431 Abnormal electrocardiogram [ECG] [EKG]: Secondary | ICD-10-CM | POA: Insufficient documentation

## 2022-02-26 DIAGNOSIS — G4733 Obstructive sleep apnea (adult) (pediatric): Secondary | ICD-10-CM | POA: Diagnosis not present

## 2022-02-26 DIAGNOSIS — Z79899 Other long term (current) drug therapy: Secondary | ICD-10-CM | POA: Diagnosis not present

## 2022-02-26 DIAGNOSIS — I5032 Chronic diastolic (congestive) heart failure: Secondary | ICD-10-CM | POA: Diagnosis not present

## 2022-02-26 DIAGNOSIS — R0609 Other forms of dyspnea: Secondary | ICD-10-CM | POA: Diagnosis not present

## 2022-02-26 MED ORDER — EMPAGLIFLOZIN 10 MG PO TABS: 10.0000 mg | ORAL_TABLET | Freq: Every day | ORAL | 6 refills | Status: AC

## 2022-02-26 NOTE — Patient Instructions (Signed)
Start Jardiance 10 mg Daily ? ?Your provider has prescribed Jardiance for you. Please be aware the most common side effect of this medication is urinary tract infections and yeast infections. Please practice good hygiene and keep this area clean and dry to help prevent this. If you do begin to have symptoms of these infections, such as difficulty urinating or painful urination,  please let us know. ? ?Your physician has recommended that you have a chest X-ray and a VQ Scan, these can be done at [facility], once approved by your insurance company we will call you to schedule ? ?Your physician recommends that you schedule a follow-up appointment in: 4 months (July 2023), **PLEASE CALL OUR OFFICE IN MAY TO SCHEDULE THIS APPOINTMENT ? ?If you have any questions or concerns before your next appointment please send [place] a message through [employer] or call our office at [phone].   ? ?TO LEAVE A MESSAGE FOR THE NURSE SELECT OPTION 2, PLEASE LEAVE A MESSAGE INCLUDING: ?YOUR NAME ?DATE OF BIRTH ?CALL BACK NUMBER ?REASON FOR CALL**this is important as we prioritize the call backs ? ?YOU WILL RECEIVE A CALL BACK THE SAME DAY AS [place] AS YOU CALL BEFORE 4:00 PM ? ?At the [facility], you and your health needs are our priority. As part of our continuing mission to provide you with exceptional heart care, we have created designated Provider Care Teams. These Care Teams include your primary Cardiologist (physician) and Advanced Practice Providers (APPs- Physician Assistants and Nurse Practitioners) who all work together to provide you with the care you need, when you need it.  ? ?You may see any of the following providers on your designated Care Team at your next follow up: ?Dr Arvilla Meres ?Dr Marca Ancona ?Tonye Becket, NP ?Robbie Lis, PA ?Jessica Milford,NP ?Anna Genre, PA ?Karle Plumber, PharmD ? ? ?Please be sure to bring in all your medications bottles to every appointment.  ? ? ? ?

## 2022-02-26 NOTE — Progress Notes (Signed)
? ?[facility] CONSULT NOTE ? ?Referring Physician:Samuel Diona Browner, MD ?Primary Care: Royann Shivers, PA-C ?Primary Cardiologist: Nona Dell, MD ? ? ?HPI: ? ?Jon Warren is a 66 y.o. with morbid obesity, COPD, DM2, HTN, previous PE (2013), non-obstructive CAD and LBBB referred by Dr. Diona Browner for further w/u of pulmonary HTN. ? ?Cath 2/23  ?- Left dominant system. LAD m50%. OM-1 705% RCA 100% ?- EF 60-65% ?RA = 9 ?RV = 65/12 ?PA = 65/26 (42) ?PCW = 17 ?Fick cardiac output/index = 6.6/3.0 ?PVR = 3.8 WU ?Ao sat = 95% ?PA sat = 70%, 71% ?SVC sat = 73% ? ?PAH felt due mainly to COPD/OSA. Encouraged weight loss and supplemental O2. Has seen Dr. Craige Cotta and HST ordered. PFTs with severe restriction. Hall walk with Dr. Diona Browner sats dropped to 88% ? ?Here for [facility] consult. Says he feels pretty good. Works at [employer]. Also works in the yard. Says he gained over 100 pounds since high school. Now trying to lose weight with low-carb diet. Down 3-4 pounds Has swelling in hands and feet. Takes HCTZ. Previous switched to lasix but got more SOB. Snores a lot. Quit smoking 15 years ago. No syncope or presyncope. No CP  ? ? ?Studies: ? ?PFT 02/05/22 >> FEV1 1.60 (51%), FEV1% 71, TLC 3.49 (54%), DLCO 51% ?Echo 11/05/21 >> EF 65 to 70%, mod LVH, RVSP 43.7 mmHg ? ?Review of Systems: [y] = yes, [ ]  = no  ? ?General: Weight gain ]; Weight loss [ ] ; Anorexia [ ] ; Fatigue [ y]; Fever [ ] ; Chills [ ] ; Weakness [ ]   ?Cardiac: Chest pain/pressure [ ] ; Resting SOB [ ] ; Exertional SOB [ y]; Orthopnea [ ] ; Pedal [place] ]; Palpitations [ ] ; Syncope [ ] ; Presyncope [ ] ; Paroxysmal nocturnal dyspnea[ ]   ?Pulmonary: Cough [ ] ; Wheezing[ ] ; Hemoptysis[ ] ; Sputum [ ] ; Snoring [ y]  ?GI: Vomiting[ ] ; Dysphagia[ ] ; Melena[ ] ; Hematochezia [ ] ; Heartburn[ ] ; Abdominal pain [ ] ; Constipation [ ] ; Diarrhea [ ] ; BRBPR [ ]   ?GU: Hematuria[ ] ; Dysuria [ ] ; Nocturia[ ]   ?Vascular: Pain in legs with walking [ ] ; Pain in  feet with lying flat [ ] ; Non-healing sores [ ] ; Stroke [ ] ; TIA [ ] ; Slurred speech [ ] ;  ?Neuro: Headaches[ ] ; Vertigo[ ] ; Seizures[ ] ; Paresthesias[ ] ;Blurred vision [ ] ; Diplopia [ ] ; Vision changes [ ]   ?Ortho/Skin: Arthritis ]; Joint pain [ y]; Muscle pain [ ] ; Joint swelling [ ] ; Back Pain [ ] ; Rash [ ]   ?Psych: Depression[ ] ; Anxiety[ ]   ?Heme: Bleeding problems [ ] ; Clotting disorders [ ] ; Anemia [ ]   ?Endocrine: Diabetes [ ] ; Thyroid dysfunction[ ]  ? ? ?Past Medical History:  ?Diagnosis Date  ? BPH (benign prostatic hyperplasia)   ? CAD (coronary artery disease)   ? COPD (chronic obstructive pulmonary disease) (HCC)   ? Diabetic neuropathy (HCC)   ? Hyperlipidemia   ? Hypertension   ? LBBB (left bundle branch block)   ? Pulmonary embolism (HCC) 2013  ? Pulmonary hypertension (HCC)   ? Restrictive lung disease   ? From congenital right rib cage deformity and obesity  ? Stroke [facility])   ? Type 2 diabetes mellitus (HCC)   ? ? ?Current Outpatient Medications  ?Medication Sig Dispense Refill  ? aspirin 325 MG tablet Take 325 mg by mouth daily.    ? carvedilol (COREG) 6.25 MG tablet Take 6.25 mg by mouth 2 (two) times daily  with a meal.    ? Fluticasone-Umeclidin-Vilant (TRELEGY ELLIPTA) 100-62.5-25 MCG/ACT AEPB Inhale 1 puff into the lungs daily.    ? gabapentin (NEURONTIN) 300 MG capsule Take 300 mg by mouth 2 (two) times daily.    ? hydrochlorothiazide (HYDRODIURIL) 25 MG tablet Take 1 tablet (25 mg total) by mouth daily. 90 tablet 3  ? insulin glargine (LANTUS SOLOSTAR) 100 UNIT/ML Solostar Pen Inject 56 Units into the skin 2 (two) times daily.    ? lisinopril (ZESTRIL) 40 MG tablet Take 40 mg by mouth daily.    ? metFORMIN (GLUCOPHAGE-XR) 500 MG 24 hr tablet Take 500 mg by mouth daily with breakfast.    ? Multiple Vitamins-Minerals (MULTIVITAMIN WITH MINERALS) tablet Take 1 tablet by mouth daily.    ? rosuvastatin (CRESTOR) 10 MG tablet Take 1 tablet (10 mg total) by mouth daily. 90 tablet 3  ? tamsulosin  (FLOMAX) 0.4 MG CAPS capsule Take 0.4 mg by mouth daily.    ? ?Current Facility-Administered Medications  ?Medication Dose Route Frequency Provider Last Rate Last Admin  ? sodium chloride flush (NS) 0.9 % injection 3 mL  3 mL Intravenous Q12H Jonelle Sidle, MD      ? ? ?No Known Allergies ? ?  ?Social History  ? ?Socioeconomic History  ? Marital status: Single  ?  Spouse name: Not on file  ? Number of children: Not on file  ? Years of education: Not on file  ? Highest education level: Not on file  ?Occupational History  ? Not on file  ?Tobacco Use  ? Smoking status: Former  ?  Packs/day: 2.00  ?  Types: Cigarettes  ?  Start date: 12/15/1974  ?  Quit date: 12/15/2005  ?  Years since quitting: 16.2  ? Smokeless tobacco: Never  ?Substance and Sexual Activity  ? Alcohol use: Yes  ?  Comment: Occasional  ? Drug use: Never  ? Sexual activity: Not on file  ?Other Topics Concern  ? Not on file  ?Social History Narrative  ? Not on file  ? ?Social Determinants of Health  ? ?Financial Resource Strain: Not on file  ?Food Insecurity: Not on file  ?Transportation Needs: Not on file  ?Physical Activity: Not on file  ?Stress: Not on file  ?Social Connections: Not on file  ?Intimate Partner Violence: Not on file  ? ? ?  ?Family History  ?Problem Relation Age of Onset  ? Hypertension Mother   ? ? ?Vitals:  ? 02/26/22 1444  ?BP: (!) 160/90  ?Pulse: 73  ?SpO2: 93%  ?Weight: 115.4 kg (254 lb 6.4 oz)  ? ? ?PHYSICAL EXAM: ?General:  Obese male No respiratory difficulty ?HEENT: normal ?Neck: supple. Thick neck hard to see JVP  Carotids 2+ bilat; no bruits. No lymphadenopathy or thryomegaly appreciated. ?Cor: PMI nondisplaced. Regular rate & rhythm. No rubs, gallops or murmurs. ?Lungs: clear ?Abdomen: obese soft, nontender, nondistended. No hepatosplenomegaly. No bruits or masses. Good bowel sounds. ?Extremities: no cyanosis, clubbing, rash, edema ?Neuro: alert & oriented x 3, cranial nerves grossly intact. moves all 4 extremities w/o  difficulty. Affect pleasant. ? ?ECG:NSR 76 LBBB 148 ms Personally reviewed ? ? ? ?ASSESSMENT & PLAN: ? ?1. PAH ?- likely multifactorial suspect mostly WHO GROUP 2 & 3 but has h/o remote PE ?- RHC 2/23 RA 9 PA 65/26 (42) PCW 17 Fick 6.6/3.0 PVR 3.8 WU ?- Await sleep study ?- Will send for VQ scan to exclude CTEPH ?- Start Jardiance for diastolic HF ?-  Strongly encouraged weight loss ?- No role for selective pulmonary artery vasodilators at this point ? ?2. Morbid Obesity ?- Body mass index is 39.84 kg/m?. ?- stressed need for weight loss ?- consider GLP1RA ? ?3. Chronic diastolic HF ?- volume mildly elevated. ?- add Jardiance ? ?4. CAD, nonobstructive  ?- no s/s angina ?- continue ASA/statin ? ? ?Arvilla Meresaniel Lakishia Bourassa, MD  ?3:30 PM ? ?

## 2022-03-06 ENCOUNTER — Telehealth (HOSPITAL_COMMUNITY): Payer: Self-pay | Admitting: *Deleted

## 2022-03-06 NOTE — Telephone Encounter (Signed)
? ?  VQ scan no pre cert reqd ?

## 2022-03-12 ENCOUNTER — Telehealth (HOSPITAL_COMMUNITY): Payer: Self-pay | Admitting: Vascular Surgery

## 2022-03-12 NOTE — Telephone Encounter (Signed)
Left pt detailed VM giving NUC med appt @ [facility] 4/3 @ 11am, asked pt to call back to confirm appt ?

## 2022-03-17 ENCOUNTER — Ambulatory Visit (HOSPITAL_COMMUNITY)
Admission: RE | Admit: 2022-03-17 | Discharge: 2022-03-17 | Disposition: A | Discharge status: Home / Self Care | Payer: 59 | Source: Ambulatory Visit | Attending: Internal Medicine | Admitting: Internal Medicine

## 2022-03-17 DIAGNOSIS — R0609 Other forms of dyspnea: Secondary | ICD-10-CM | POA: Insufficient documentation

## 2022-03-17 DIAGNOSIS — I272 Pulmonary hypertension, unspecified: Secondary | ICD-10-CM | POA: Insufficient documentation

## 2022-03-17 MED ORDER — TECHNETIUM TO 99M ALBUMIN AGGREGATED
4.4000 | Freq: Once | INTRAVENOUS | Status: AC | PRN
  Administered 2022-03-17: 4.4 via INTRAVENOUS

## 2022-04-04 ENCOUNTER — Other Ambulatory Visit (HOSPITAL_COMMUNITY): Payer: 59

## 2022-04-08 ENCOUNTER — Encounter (HOSPITAL_COMMUNITY): Payer: 59

## 2022-04-10 ENCOUNTER — Ambulatory Visit: Payer: 59

## 2022-04-10 DIAGNOSIS — G4733 Obstructive sleep apnea (adult) (pediatric): Secondary | ICD-10-CM | POA: Diagnosis not present

## 2022-04-10 DIAGNOSIS — R0683 Snoring: Secondary | ICD-10-CM

## 2022-04-16 ENCOUNTER — Ambulatory Visit: Payer: 59 | Admitting: Cardiology

## 2022-04-25 ENCOUNTER — Telehealth: Payer: Self-pay | Admitting: Pulmonary Disease

## 2022-04-25 DIAGNOSIS — G4733 Obstructive sleep apnea (adult) (pediatric): Secondary | ICD-10-CM | POA: Diagnosis not present

## 2022-04-25 NOTE — Telephone Encounter (Signed)
HST 04/10/22 >> AHI 70.8, SpO2 low 59%.  Spent 315.7 min with SpO2 < 89%. ? ? ?Please inform him that his sleep study shows very severe obstructive sleep apnea with low oxygen at night..  If he is agreeable, then please arrange for an in lab CPAP titration study.  He needs to do this study in the sleep lab to determine if he needs to be set up with supplemental oxygen at night with CPAP.  We will call him with the results of the CPAP titration study and schedule a follow up visit after this. ? ? ?

## 2022-04-25 NOTE — Telephone Encounter (Signed)
ATC patient.  LMTCB. 

## 2022-04-25 NOTE — Telephone Encounter (Signed)
Patient returned call. Went over results with patient and he voiced understanding. He was agreeable to doing CPAP titration. Order placed. Nothing further needed  ?

## 2022-07-01 ENCOUNTER — Ambulatory Visit: Payer: 59 | Attending: Pulmonary Disease | Admitting: Pulmonary Disease

## 2022-07-01 DIAGNOSIS — G4736 Sleep related hypoventilation in conditions classified elsewhere: Secondary | ICD-10-CM | POA: Insufficient documentation

## 2022-07-01 DIAGNOSIS — G4733 Obstructive sleep apnea (adult) (pediatric): Secondary | ICD-10-CM | POA: Diagnosis present

## 2022-07-03 ENCOUNTER — Telehealth: Payer: Self-pay | Admitting: Pulmonary Disease

## 2022-07-03 DIAGNOSIS — G4733 Obstructive sleep apnea (adult) (pediatric): Secondary | ICD-10-CM

## 2022-07-03 NOTE — Telephone Encounter (Signed)
Spoke with patient regarding CPAP titration  results. They verbalized understanding. No further questions.  Nothing further needed at this time. Patient is going to call back to make his ov after he receives CPAP machine.

## 2022-07-03 NOTE — Telephone Encounter (Signed)
CPAP 07/01/22 >> CPAP 12 cm H2O with 2 liters >> AHI 0, +R, +S   Please let him know that he did well with CPAP during the sleep study once he got the right pressure setting.  He did need to use supplemental oxygen with CPAP.  Please arrange for him to be set up with a Resmed CPAP at 12 cm H2O with heated humidity and mask of choice, and he needs to have 2 liters oxygen at night added in with CPAP.  He needs an ROV in 3 to 4 months.

## 2022-07-03 NOTE — Procedures (Signed)
Patient Name: Jon Warren, Jon Warren Date: 07/01/2022 Gender: Male D.O.B: [birth year] Age (years): 13 Referring Provider: Coralyn Helling MD, ABSM Height (inches): 66 Interpreting Physician: Coralyn Helling MD, ABSM Weight (lbs): 242 RPSGT: Alfonso Ellis BMI: 39 MRN: [id number] Neck Size: 19.50  CLINICAL INFORMATION The patient is referred for a CPAP titration to treat sleep apnea.  Date of HST 04/10/22: AHI 70.8, SpO2 low 59%.  Spent 315.7 minutes of test time with an SpO2 < 89%.   SLEEP STUDY TECHNIQUE As per the AASM Manual for the Scoring of Sleep and Associated Events v2.3 (April 2016) with a hypopnea requiring 4% desaturations.  The channels recorded and monitored were frontal, central and occipital EEG, electrooculogram (EOG), submentalis EMG (chin), nasal and oral airflow, thoracic and abdominal wall motion, anterior tibialis EMG, snore microphone, electrocardiogram, and pulse oximetry. Continuous positive airway pressure (CPAP) was initiated at the beginning of the study and titrated to treat sleep-disordered breathing.  MEDICATIONS Medications self-administered by patient taken the night of the study : N/A  TECHNICIAN COMMENTS Comments added by technician: CPAP therapy started at 4 CWP with an EPR of 3. Patient was restless all through the night. Patient had more than two awakenings to use the bathroom. Patient talked in his/her sleep. CPAP titration increased to 12 CWP. Optimal pressure obtained with good control of events in REM supine stage. Patient tolerated CPAP very well. O2 supplement of 2 lpm added, due to lab protocol, during latter REM stage of the study. Significant PLMS noticed throughout study Comments added by scorer: N/A  RESPIRATORY PARAMETERS Optimal PAP Pressure (cm): 12 AHI at Optimal Pressure (/hr): 0 Overall Minimal O2 (%): 76.00 Supine % at Optimal Pressure (%): 94 Minimal O2 at Optimal Pressure (%): 87.0    He had improvement in obstructive  respiratory events with CPAP.  He had continued oxygen desaturation below 88% and this lasted for 5 minutes.  He had 2 liters oxygen applied to CPAP and his oxygenation improved.  SLEEP ARCHITECTURE The study was initiated at 10:16:55 PM and ended at 5:20:43 AM.  Sleep onset time was 23.7 minutes and the sleep efficiency was 37.4%. The total sleep time was 158.6 minutes.  The patient spent 8.51% of the night in stage N1 sleep, 61.84% in stage N2 sleep, 22.70% in stage N3 and 6.9% in REM.Stage REM latency was 364.0 minutes  Wake after sleep onset was 241.5. Alpha intrusion was absent. Supine sleep was 38.79%. He had difficulty with sleep initiation and maintenance, and thiis was partially due to respiratory events.  CARDIAC DATA The 2 lead EKG demonstrated sinus rhythm. The mean heart rate was 61.12 beats per minute. Other EKG findings include: PVCs.  LEG MOVEMENT DATA The total Periodic Limb Movements of Sleep (PLMS) were 0. The PLMS index was 0.00. A PLMS index of <15 is considered normal in adults.  IMPRESSIONS - He did best with CPAP 12 cm H2O.  He was observed in REM and supine sleep at this pressure setting. - He needed 2 liters oxygen applied to CPAP.  DIAGNOSIS - Obstructive Sleep Apnea  - Nocturnal Hypoxemia  RECOMMENDATIONS - Trial of CPAP therapy on 12 cm H2O with 2 liters oxygen. - He was fitted with a Medium size Resmed Nasal Cradle AirFit N30 mask and heated humidification. - Avoid alcohol, sedatives and other CNS depressants that may worsen sleep apnea and disrupt normal sleep architecture. - Sleep hygiene should be reviewed to assess factors that may improve sleep quality. - Weight management and  regular exercise should be initiated or continued.  [Electronically signed] 07/03/2022 08:48 AM  Coralyn Helling MD, ABSM Diplomate, American Board of Sleep Medicine NPI: [id number]  [facility] PH: [phone]   FX: [phone] ACCREDITED BY  THE AMERICAN ACADEMY OF SLEEP MEDICINE

## 2022-07-28 ENCOUNTER — Telehealth: Payer: Self-pay | Admitting: Pulmonary Disease

## 2022-07-28 NOTE — Telephone Encounter (Signed)
Called and spoke to home medical equipment tech and she states that she did not see an order in for this patient for a cpap machine. She asked that we re fax order, HST, insurance info and demographics sheet to [phone]. She states we should advise the patient to wait until at least tomorrow afternoon for them to start working on the order.   Sent message to [facility] and she is going to refax everything to [facility]   ATC to notify patient. No answer. Left patient a detailed vm (ok per dpr) letting him know we are refaxing the order and to wait to contact [facility] until at least tomorrow afternoon and to call back if he needs anything further in the meantime.   Closing encounter and will addend if patient returns call.

## 2022-07-31 ENCOUNTER — Telehealth: Payer: Self-pay | Admitting: Pulmonary Disease

## 2022-08-04 NOTE — Telephone Encounter (Signed)
Called and spoke to Home medical rep with [facility] and she states that the tech who does prior auths for the office had completed the patients order this morning and was planning to call patient to have him scheduled to pick up his CPAP.  She advised that we have patient call them at [phone] and ask to be scheduled    ATC patient and left him a message letting him know this information and advised him to callback with further qeustions. Nothing further needed at this time.

## 2022-12-24 ENCOUNTER — Other Ambulatory Visit: Payer: Self-pay | Admitting: Cardiology

## 2023-02-18 IMAGING — NM NM PULMONARY PERF PARTICULATE
8 series · 8 of 8 positions shown · non-contrast
Comparison: Chest x-ray 03/17/2022.

CLINICAL DATA: Dyspnea on exertion.  Pulmonary hypertension.

EXAM:
NUCLEAR MEDICINE PERFUSION LUNG SCAN
TECHNIQUE: Perfusion images were obtained in multiple projections after
intravenous injection of radiopharmaceutical.
Ventilation scans intentionally deferred if perfusion scan and chest
x-ray adequate for interpretation during COVID 19 epidemic.
RADIOPHARMACEUTICALS:  4.4 mCi 2c-22m MAA IV

[Series 1: ant/post perf · 4.14mm/px · 1 of 1 slices shown (1 of 2)]
[im 1/1]
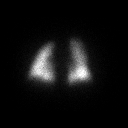

[Series 1: ant/post perf · 4.14mm/px · 1 of 1 slices shown (2 of 2)]
[im 1/1]
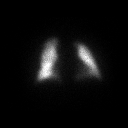

[Series 2: lao/rpo perf · 4.14mm/px · 1 of 1 slices shown (1 of 2)]
[im 1/1]
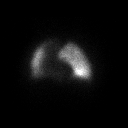

[Series 2: lao/rpo perf · 4.14mm/px · 1 of 1 slices shown (2 of 2)]
[im 1/1]
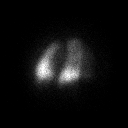

[Series 3: lt lat/rt lat perf · 4.14mm/px · 1 of 1 slices shown (1 of 2)]
[im 1/1]
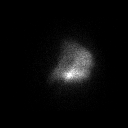

[Series 3: lt lat/rt lat perf · 4.14mm/px · 1 of 1 slices shown (2 of 2)]
[im 1/1]
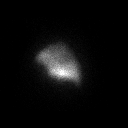

[Series 4: lpo/rao perf · 4.14mm/px · 1 of 1 slices shown (1 of 2)]
[im 1/1]
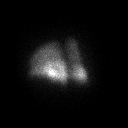

[Series 4: lpo/rao perf · 4.14mm/px · 1 of 1 slices shown (2 of 2)]
[im 1/1]
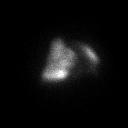

[8 of 8 positions shown; findings below may reference images not displayed]

FINDINGS: Multiple perfusion images reveal no focal perfusion defects. Exam
appears unremarkable.
IMPRESSION: Negative exam.  No focal perfusion defects.

## 2023-03-12 ENCOUNTER — Other Ambulatory Visit: Payer: Self-pay | Admitting: Cardiology

## 2023-03-20 DIAGNOSIS — J449 Chronic obstructive pulmonary disease, unspecified: Secondary | ICD-10-CM | POA: Diagnosis not present

## 2023-03-20 DIAGNOSIS — E1165 Type 2 diabetes mellitus with hyperglycemia: Secondary | ICD-10-CM | POA: Diagnosis not present

## 2023-03-20 DIAGNOSIS — E875 Hyperkalemia: Secondary | ICD-10-CM | POA: Diagnosis not present

## 2023-03-20 DIAGNOSIS — E1140 Type 2 diabetes mellitus with diabetic neuropathy, unspecified: Secondary | ICD-10-CM | POA: Diagnosis not present

## 2023-03-20 DIAGNOSIS — I10 Essential (primary) hypertension: Secondary | ICD-10-CM | POA: Diagnosis not present

## 2023-03-20 DIAGNOSIS — G4733 Obstructive sleep apnea (adult) (pediatric): Secondary | ICD-10-CM | POA: Diagnosis not present

## 2023-03-20 DIAGNOSIS — E7849 Other hyperlipidemia: Secondary | ICD-10-CM | POA: Diagnosis not present

## 2023-03-24 DIAGNOSIS — I10 Essential (primary) hypertension: Secondary | ICD-10-CM | POA: Diagnosis not present

## 2023-03-24 DIAGNOSIS — Z1389 Encounter for screening for other disorder: Secondary | ICD-10-CM | POA: Diagnosis not present

## 2023-03-24 DIAGNOSIS — N400 Enlarged prostate without lower urinary tract symptoms: Secondary | ICD-10-CM | POA: Diagnosis not present

## 2023-03-24 DIAGNOSIS — G4733 Obstructive sleep apnea (adult) (pediatric): Secondary | ICD-10-CM | POA: Diagnosis not present

## 2023-03-24 DIAGNOSIS — E1165 Type 2 diabetes mellitus with hyperglycemia: Secondary | ICD-10-CM | POA: Diagnosis not present

## 2023-03-24 DIAGNOSIS — E7849 Other hyperlipidemia: Secondary | ICD-10-CM | POA: Diagnosis not present

## 2023-03-24 DIAGNOSIS — E875 Hyperkalemia: Secondary | ICD-10-CM | POA: Diagnosis not present

## 2023-03-24 DIAGNOSIS — J449 Chronic obstructive pulmonary disease, unspecified: Secondary | ICD-10-CM | POA: Diagnosis not present

## 2023-03-24 DIAGNOSIS — E1140 Type 2 diabetes mellitus with diabetic neuropathy, unspecified: Secondary | ICD-10-CM | POA: Diagnosis not present

## 2023-03-24 DIAGNOSIS — Z1331 Encounter for screening for depression: Secondary | ICD-10-CM | POA: Diagnosis not present

## 2023-03-24 DIAGNOSIS — Z6839 Body mass index (BMI) 39.0-39.9, adult: Secondary | ICD-10-CM | POA: Diagnosis not present

## 2023-04-14 DIAGNOSIS — E875 Hyperkalemia: Secondary | ICD-10-CM | POA: Diagnosis not present

## 2023-04-23 DIAGNOSIS — H524 Presbyopia: Secondary | ICD-10-CM | POA: Diagnosis not present

## 2023-04-28 DIAGNOSIS — H25813 Combined forms of age-related cataract, bilateral: Secondary | ICD-10-CM | POA: Diagnosis not present

## 2023-04-28 DIAGNOSIS — E119 Type 2 diabetes mellitus without complications: Secondary | ICD-10-CM | POA: Diagnosis not present

## 2023-05-12 DIAGNOSIS — G4733 Obstructive sleep apnea (adult) (pediatric): Secondary | ICD-10-CM | POA: Diagnosis not present

## 2023-05-14 DIAGNOSIS — E875 Hyperkalemia: Secondary | ICD-10-CM | POA: Diagnosis not present

## 2023-05-14 DIAGNOSIS — E1165 Type 2 diabetes mellitus with hyperglycemia: Secondary | ICD-10-CM | POA: Diagnosis not present

## 2023-05-14 DIAGNOSIS — I10 Essential (primary) hypertension: Secondary | ICD-10-CM | POA: Diagnosis not present

## 2023-06-03 ENCOUNTER — Other Ambulatory Visit: Payer: Self-pay | Admitting: Cardiology

## 2023-06-19 ENCOUNTER — Inpatient Hospital Stay (HOSPITAL_COMMUNITY): Admission: RE | Admit: 2023-06-19 | Payer: 59 | Source: Ambulatory Visit

## 2023-06-19 ENCOUNTER — Encounter (HOSPITAL_COMMUNITY)
Admission: RE | Admit: 2023-06-19 | Discharge: 2023-06-19 | Disposition: A | Discharge status: Home / Self Care | Payer: Medicare HMO | Source: Ambulatory Visit | Attending: Optometry | Admitting: Optometry

## 2023-06-19 DIAGNOSIS — H2511 Age-related nuclear cataract, right eye: Secondary | ICD-10-CM | POA: Diagnosis not present

## 2023-06-19 NOTE — Pre-Procedure Instructions (Signed)
Attempted pre-op phone call. Left VM for him to call us back. 

## 2023-06-22 ENCOUNTER — Encounter (HOSPITAL_COMMUNITY): Payer: Self-pay

## 2023-06-22 ENCOUNTER — Other Ambulatory Visit: Payer: Self-pay

## 2023-06-22 NOTE — H&P (Signed)
Surgical History & Physical  Patient Name: Jon Warren  DOB: 10-01-1956  Surgery: Cataract extraction with intraocular lens implant phacoemulsification; Right Eye Surgeon: Pecolia Ades MD Surgery Date: 06/26/2023 Pre-Op Date: 04/28/2023  HPI: A 86 Yr. old male patient present for cataract eval per Dr. Earlene Plater. 1. The patient complains of difficulty when seeing street signs, which began 8-9 months ago. Both eyes are affected OD>OS. The episode is constant. The condition's severity is worsening. This is negatively affecting the patient's quality of life and the patient is unable to function adequately in life with the current level of vision.  Medical History: Cataracts  Diabetes High Blood Pressure hx blood clot in lung 2013  Review of Systems Cardiovascular High Blood Pressure Endocrine diabetic All recorded systems are negative except as noted above.  Social Former smoker  Medication Aspirin, Insulin, Metformin, BP pill   Sx/Procedures None  Drug Allergies  NKDA  History & Physical: Heent: cataracts NECK: supple without bruits LUNGS: lungs clear to auscultation CV: regular rate and rhythm Abdomen: soft and non-tender  Impression & Plan: Assessment: 1.  CATARACT AGE-RELATED COMBINED FORMS; Both Eyes (H25.813) 2.  Diabetes Type 2 No retinopathy (E11.9) 3.  Myopia ; Both Eyes (H52.13) 4.  Jeane term (current) use of oral hypoglycemic (Z79.84)  Plan: 1.  Cataracts are visually significant and account for the patient's complaints. Discussed all risks, benefits, procedures and recovery, including infection, loss of vision and eye, need for glasses after surgery or additional procedures. Patient understands changing glasses will not improve vision. Patient indicated understanding of procedure. All questions answered. Patient desires to have surgery, recommend phacoemulsification with intraocular lens. Patient to have preliminary testing necessary (Argos/IOL Master, Mac  OCT, TOPO) Educational materials provided:Cataract. RECOMMEND STANDARD VS EYEHANCE VS TORIC VS VIVTY  Plan: - proceed with surgery OD, followed by OS - DIBOO with best distance OD, then select for OS - DM without DR - Ok with lying flat on back - no fuchs, no prior eye surgery  2.  DM x 2 years, no signs of retinopathy. Continue good BP and BS control  3.  per Dr. Earlene Plater  4.  as above

## 2023-06-26 ENCOUNTER — Encounter (HOSPITAL_COMMUNITY): Payer: Self-pay | Admitting: Optometry

## 2023-06-26 ENCOUNTER — Ambulatory Visit (HOSPITAL_COMMUNITY)
Admission: RE | Admit: 2023-06-26 | Discharge: 2023-06-26 | Disposition: A | Discharge status: Home / Self Care | Payer: Medicare HMO | Attending: Optometry | Admitting: Optometry

## 2023-06-26 ENCOUNTER — Ambulatory Visit (HOSPITAL_COMMUNITY): Payer: Medicare HMO | Admitting: Anesthesiology

## 2023-06-26 ENCOUNTER — Encounter (HOSPITAL_COMMUNITY)
Admission: RE | Disposition: A | Discharge status: Home / Self Care | Payer: Self-pay | Source: Home / Self Care | Attending: Optometry

## 2023-06-26 DIAGNOSIS — Z86711 Personal history of pulmonary embolism: Secondary | ICD-10-CM | POA: Diagnosis not present

## 2023-06-26 DIAGNOSIS — I447 Left bundle-branch block, unspecified: Secondary | ICD-10-CM | POA: Diagnosis not present

## 2023-06-26 DIAGNOSIS — I4891 Unspecified atrial fibrillation: Secondary | ICD-10-CM | POA: Insufficient documentation

## 2023-06-26 DIAGNOSIS — Z794 Long term (current) use of insulin: Secondary | ICD-10-CM

## 2023-06-26 DIAGNOSIS — E1136 Type 2 diabetes mellitus with diabetic cataract: Secondary | ICD-10-CM | POA: Insufficient documentation

## 2023-06-26 DIAGNOSIS — J449 Chronic obstructive pulmonary disease, unspecified: Secondary | ICD-10-CM | POA: Insufficient documentation

## 2023-06-26 DIAGNOSIS — Z7984 Long term (current) use of oral hypoglycemic drugs: Secondary | ICD-10-CM | POA: Insufficient documentation

## 2023-06-26 DIAGNOSIS — I10 Essential (primary) hypertension: Secondary | ICD-10-CM | POA: Diagnosis not present

## 2023-06-26 DIAGNOSIS — E785 Hyperlipidemia, unspecified: Secondary | ICD-10-CM | POA: Insufficient documentation

## 2023-06-26 DIAGNOSIS — Z79899 Other long term (current) drug therapy: Secondary | ICD-10-CM | POA: Insufficient documentation

## 2023-06-26 DIAGNOSIS — I2510 Atherosclerotic heart disease of native coronary artery without angina pectoris: Secondary | ICD-10-CM

## 2023-06-26 DIAGNOSIS — E1140 Type 2 diabetes mellitus with diabetic neuropathy, unspecified: Secondary | ICD-10-CM | POA: Insufficient documentation

## 2023-06-26 DIAGNOSIS — H2511 Age-related nuclear cataract, right eye: Secondary | ICD-10-CM | POA: Diagnosis not present

## 2023-06-26 DIAGNOSIS — Z87891 Personal history of nicotine dependence: Secondary | ICD-10-CM | POA: Diagnosis not present

## 2023-06-26 DIAGNOSIS — H25811 Combined forms of age-related cataract, right eye: Secondary | ICD-10-CM

## 2023-06-26 HISTORY — PX: CATARACT EXTRACTION W/PHACO: SHX586

## 2023-06-26 LAB — GLUCOSE, CAPILLARY: Glucose-Capillary: 129 mg/dL — ABNORMAL HIGH (ref 70–99)

## 2023-06-26 SURGERY — PHACOEMULSIFICATION, CATARACT, WITH IOL INSERTION
Anesthesia: Monitor Anesthesia Care | Site: Eye | Laterality: Right

## 2023-06-26 MED ORDER — MIDAZOLAM HCL 2 MG/2ML IJ SOLN
INTRAMUSCULAR | Status: AC
  Filled 2023-06-26: qty 2

## 2023-06-26 MED ORDER — PHENYLEPHRINE-KETOROLAC 1-0.3 % IO SOLN
INTRAOCULAR | Status: DC | PRN
  Administered 2023-06-26: 500 mL via OPHTHALMIC

## 2023-06-26 MED ORDER — PHENYLEPHRINE HCL 2.5 % OP SOLN
1.0000 [drp] | OPHTHALMIC | Status: AC
  Administered 2023-06-26 (×3): 1 [drp] via OPHTHALMIC

## 2023-06-26 MED ORDER — TROPICAMIDE 1 % OP SOLN
1.0000 [drp] | OPHTHALMIC | Status: AC
  Administered 2023-06-26 (×3): 1 [drp] via OPHTHALMIC

## 2023-06-26 MED ORDER — STERILE WATER FOR IRRIGATION IR SOLN
Status: DC | PRN
  Administered 2023-06-26: 250 mL

## 2023-06-26 MED ORDER — LIDOCAINE HCL 3.5 % OP GEL
1.0000 | Freq: Once | OPHTHALMIC | Status: AC
  Administered 2023-06-26: 1 via OPHTHALMIC

## 2023-06-26 MED ORDER — POVIDONE-IODINE 5 % OP SOLN
OPHTHALMIC | Status: DC | PRN
  Administered 2023-06-26: 1 via OPHTHALMIC

## 2023-06-26 MED ORDER — LIDOCAINE HCL (PF) 1 % IJ SOLN
INTRAMUSCULAR | Status: DC | PRN
  Administered 2023-06-26: 1 mL

## 2023-06-26 MED ORDER — BSS IO SOLN
INTRAOCULAR | Status: DC | PRN
  Administered 2023-06-26: 15 mL via INTRAOCULAR

## 2023-06-26 MED ORDER — TETRACAINE HCL 0.5 % OP SOLN
1.0000 [drp] | OPHTHALMIC | Status: AC
  Administered 2023-06-26 (×3): 1 [drp] via OPHTHALMIC

## 2023-06-26 MED ORDER — NEOMYCIN-POLYMYXIN-DEXAMETH 3.5-10000-0.1 OP SUSP
OPHTHALMIC | Status: DC | PRN
  Administered 2023-06-26: 2 [drp] via OPHTHALMIC

## 2023-06-26 MED ORDER — SIGHTPATH DOSE#1 NA HYALUR & NA CHOND-NA HYALUR IO KIT
PACK | INTRAOCULAR | Status: DC | PRN
  Administered 2023-06-26: 1 via OPHTHALMIC

## 2023-06-26 SURGICAL SUPPLY — 14 items
CATARACT SUITE SIGHTPATH (MISCELLANEOUS) ×1 IMPLANT
CLOTH BEACON ORANGE TIMEOUT ST (SAFETY) ×1 IMPLANT
DRSG TEGADERM 4X4.75 (GAUZE/BANDAGES/DRESSINGS) ×1 IMPLANT
EYE SHIELD UNIVERSAL CLEAR (GAUZE/BANDAGES/DRESSINGS) IMPLANT
FEE CATARACT SUITE SIGHTPATH (MISCELLANEOUS) ×1 IMPLANT
GLOVE BIOGEL PI IND STRL 7.0 (GLOVE) ×2 IMPLANT
LENS IOL TECNIS EYHANCE 20.5 (Intraocular Lens) IMPLANT
NDL HYPO 18GX1.5 BLUNT FILL (NEEDLE) ×1 IMPLANT
NEEDLE HYPO 18GX1.5 BLUNT FILL (NEEDLE) ×1 IMPLANT
PAD ARMBOARD 7.5X6 YLW CONV (MISCELLANEOUS) ×1 IMPLANT
POSITIONER HEAD 8X9X4 ADT (SOFTGOODS) ×1 IMPLANT
SYR TB 1ML LL NO SAFETY (SYRINGE) ×1 IMPLANT
TAPE SURG TRANSPORE 1 IN (GAUZE/BANDAGES/DRESSINGS) IMPLANT
WATER STERILE IRR 250ML POUR (IV SOLUTION) ×1 IMPLANT

## 2023-06-26 NOTE — Transfer of Care (Signed)
Immediate Anesthesia Transfer of Care Note  Patient: Jon Warren  Procedure(s) Performed: CATARACT EXTRACTION PHACO AND INTRAOCULAR LENS PLACEMENT (IOC) (Right: Eye)  Patient Location: Short Stay  Anesthesia Type:MAC  Level of Consciousness: awake  Airway & Oxygen Therapy: Patient Spontanous Breathing  Post-op Assessment: Report given to RN  Post vital signs: Reviewed and stable  Last Vitals:  Vitals Value Taken Time  BP 119/61 06/26/23 0825  Temp 37.1 C 06/26/23 0825  Pulse 58 06/26/23 0825  Resp 16 06/26/23 0825  SpO2 97 % 06/26/23 0825    Last Pain:  Vitals:   06/26/23 0653  TempSrc: Oral  PainSc: 0-No pain      Patients Stated Pain Goal: 6 (06/26/23 0653)  Complications: No notable events documented.

## 2023-06-26 NOTE — Anesthesia Preprocedure Evaluation (Addendum)
Anesthesia Evaluation  Patient identified by MRN, date of birth, ID band Patient awake    Reviewed: Allergy & Precautions, H&P , NPO status , Patient's Chart, lab work & pertinent test results  History of Anesthesia Complications Negative for: history of anesthetic complications  Airway Mallampati: III  TM Distance: >3 FB Neck ROM: Full    Dental  (+) Missing, Dental Advisory Given   Pulmonary sleep apnea and Continuous Positive Airway Pressure Ventilation , COPD, former smoker, PE   Pulmonary exam normal breath sounds clear to auscultation       Cardiovascular Exercise Tolerance: Good hypertension, Pt. on medications + CAD  + dysrhythmias Atrial Fibrillation  Rhythm:Irregular Rate:Normal  Jonelle Sidle, MD 11/05/2021 12:28 PM EST   Results reviewed.  He continues to have vigorous LVEF, 65 to 70%.  Estimated RV systolic pressure is mildly elevated at 44 mmHg.  RV contraction normal.  Interval Myoview was also low risk without significant ischemia.  Let's plan to have him stop HCTZ and replace it with Lasix 20 mg daily, KCl 10 mEq daily.  Schedule follow-up in the next 6 weeks to see how he is doing.  Check BMET in 2 weeks.   26-Feb-2022 14:57:02 [id number] ROUTINE RECORD [birth year] (65 yr) Male Caucasian Vent. rate 76 BPM PR interval 146 ms QRS duration 146 ms QT/QTcB 422/474 ms P-R-T axes 30 -18 111 Sinus rhythm with Premature atrial complexes Left bundle branch block Abnormal ECG When compared with ECG of 26-Feb-2022 14:55, No significant change was found    Neuro/Psych  Neuromuscular disease CVA  negative psych ROS   GI/Hepatic negative GI ROS, Neg liver ROS,,,  Endo/Other  diabetes, Well Controlled, Type 2, Oral Hypoglycemic Agents, Insulin Dependent    Renal/GU negative Renal ROS  negative genitourinary   Musculoskeletal negative musculoskeletal ROS (+)    Abdominal    Peds negative pediatric ROS (+)  Hematology negative hematology ROS (+)   Anesthesia Other Findings   Reproductive/Obstetrics negative OB ROS                             Anesthesia Physical Anesthesia Plan  ASA: 3  Anesthesia Plan: MAC   Post-op Pain Management: Minimal or no pain anticipated   Induction: Intravenous  PONV Risk Score and Plan: Treatment may vary due to age or medical condition  Airway Management Planned:   Additional Equipment:   Intra-op Plan:   Post-operative Plan:   Informed Consent: I have reviewed the patients History and Physical, chart, labs and discussed the procedure including the risks, benefits and alternatives for the proposed anesthesia with the patient or authorized representative who has indicated his/her understanding and acceptance.     Dental advisory given  Plan Discussed with: CRNA and Surgeon  Anesthesia Plan Comments:          Anesthesia Quick Evaluation

## 2023-06-26 NOTE — Discharge Instructions (Signed)
Please discharge patient when stable, will follow up today with Dr. Cereniti Curb at the [facility] [place] office immediately following discharge.  Leave shield in place until visit.  All paperwork with discharge instructions will be given at the office.  [facility] [place] Address:  [address]  [place], [place] [address]  Dr. Brinnley Lacap's Phone: [phone]  

## 2023-06-26 NOTE — Op Note (Signed)
Date of procedure: 06/26/23  Pre-operative diagnosis: Visually significant age-related nuclear cataract, Right Eye (H25.11)  Post-operative diagnosis: Visually significant age-related nuclear cataract, Right Eye  Procedure: Removal of cataract via phacoemulsification and insertion of intra-ocular lens J&J DIBOO +20.5D into the capsular bag of the Right Eye  Attending surgeon: Pecolia Ades, MD  Anesthesia: MAC, Topical Akten  Complications: None  Estimated Blood Loss: <30mL (minimal)  Specimens: None  Implants:  Implant Name Type Inv. Item Serial No. Manufacturer Lot No. LRB No. Used Action  LENS IOL TECNIS EYHANCE 20.5 - [id number] Intraocular Lens LENS IOL TECNIS EYHANCE 20.5 [id number] SIGHTPATH  Right 1 Implanted    Indications:  Visually significant age-related cataract, Right Eye  Procedure:  The patient was seen and identified in the pre-operative area. The operative eye was identified and dilated.  The operative eye was marked.  Topical anesthesia was administered to the operative eye.     The patient was then to the operative suite and placed in the supine position.  A timeout was performed confirming the patient, procedure to be performed, and all other relevant information.   The patient's face was prepped and draped in the usual fashion for intra-ocular surgery.  A lid speculum was placed into the operative eye and the surgical microscope moved into place and focused.  A superotemporal paracentesis was created using a 20 gauge paracentesis blade.  BSS mixed with Omidria, followed by 1% lidocaine was injected into the anterior chamber.  Viscoelastic was injected into the anterior chamber.  A temporal clear-corneal main wound incision was created using a 2.45mm microkeratome.  A continuous curvilinear capsulorrhexis was initiated using an irrigating cystitome and completed using capsulorrhexis forceps.  Hydrodissection and hydrodeliniation were performed.  Viscoelastic was  injected into the anterior chamber.  A phacoemulsification handpiece and a chopper as a second instrument were used to remove the nucleus and epinucleus. The irrigation/aspiration handpiece was used to remove any remaining cortical material.   The capsular bag was reinflated with viscoelastic, checked, and found to be intact.  The intraocular lens was inserted into the capsular bag.  The irrigation/aspiration handpiece was used to remove any remaining viscoelastic.  The clear corneal wound and paracentesis wounds were then hydrated and checked with Weck-Cels to be watertight.  The lid-speculum and drape was removed, and the patient's face was cleaned with a wet and dry 4x4.  Maxitrol drops were instilled onto the eye. A clear shield was taped over the eye. The patient was taken to the post-operative care unit in good condition, having tolerated the procedure well.  Post-Op Instructions: The patient will follow up at [facility] for a same day post-operative evaluation and will receive all other orders and instructions.

## 2023-06-26 NOTE — Anesthesia Postprocedure Evaluation (Signed)
Anesthesia Post Note  Patient: Jon Warren  Procedure(s) Performed: CATARACT EXTRACTION PHACO AND INTRAOCULAR LENS PLACEMENT (IOC) (Right: Eye)  Patient location during evaluation: Short Stay Anesthesia Type: MAC Level of consciousness: awake and alert Pain management: pain level controlled Vital Signs Assessment: vitals unstable Respiratory status: spontaneous breathing Cardiovascular status: stable Postop Assessment: no apparent nausea or vomiting Anesthetic complications: no   No notable events documented.   Last Vitals:  Vitals:   06/26/23 0653 06/26/23 0825  BP: 113/69 119/61  Pulse: 65 (!) 58  Resp: 17 16  Temp: 36.6 C 37.1 C  SpO2: 95% 97%    Last Pain:  Vitals:   06/26/23 0825  TempSrc:   PainSc: 0-No pain                 Liannah Yarbough

## 2023-06-26 NOTE — Interval H&P Note (Signed)
History and Physical Interval Note:  06/26/2023 7:19 AM  The H and P was reviewed and updated. The patient was examined.  No changes were found after exam.  The surgical eye was marked.  Jon Warren

## 2023-07-01 ENCOUNTER — Encounter (HOSPITAL_COMMUNITY): Payer: Self-pay | Admitting: Optometry

## 2023-07-02 NOTE — H&P (Signed)
Surgical History & Physical  Patient Name: Jon Warren  DOB: 11/08/1956  Surgery: Cataract extraction with intraocular lens implant phacoemulsification; Left Eye Surgeon: Pecolia Ades MD Surgery Date: 07/10/2023 Pre-Op Date: 06/30/2023  HPI: A 79 Yr. old male patient present for 4 day post op OD. Patient is doing well, happy with vision. Patient is still having difficulties with blurriness OS, reading street signs, glare at night with halos around lights, due to cataract OS. This is negatively affecting the patient's quality of life and the patient is unable to function adequately in life with the current level of vision. Patient would like to proceed with cataract sx OS.  Medical History: Cataracts  Diabetes High Blood Pressure hx blood clot in lung 2013  Review of Systems Cardiovascular High Blood Pressure Endocrine diabetic All recorded systems are negative except as noted above.  Social Former smoker   Medication Pred/brom/moxifloxacin,  Aspirin, Insulin, Metformin, BP pill  Sx/Procedures Phaco c IOL OD,   Drug Allergies  NKDA  History & Physical: Heent: PCL OD, cataract OS NECK: supple without bruits LUNGS: lungs clear to auscultation CV: regular rate and rhythm Abdomen: soft and non-tender  Impression & Plan: Assessment: 1.  CATARACT EXTRACTION STATUS; Right Eye (Z98.41) 2.  INTRAOCULAR LENS IOL ; Right Eye (Z96.1) 3.  CATARACT AGE-RELATED COMBINED FORMS; Both Eyes (H25.813) 4.  Myopia ; Left Eye (H52.12)  Plan: 1.  POD4 exam. Doing well. All post-op precautions discussed and instructions reviewed. Written instructions given.  2.  See above  3.  Cataracts are visually significant and account for the patient's complaints. Discussed all risks, benefits, procedures and recovery, including infection, loss of vision and eye, need for glasses after surgery or additional procedures. Patient understands changing glasses will not improve vision. Patient indicated  understanding of procedure. All questions answered. Patient desires to have surgery, recommend phacoemulsification with intraocular lens. Patient to have preliminary testing necessary (Argos/IOL Master, Mac OCT, TOPO) Educational materials provided:Cataract. RECOMMEND STANDARD VS EYEHANCE VS TORIC VS VIVTY  Plan: - proceed with surgery OS when ready - DIBOO with best distance OS - DM without DR - Ok with lying flat on back - no fuchs, no prior eye surgery  4.  per Dr. Earlene Plater

## 2023-07-03 DIAGNOSIS — H2512 Age-related nuclear cataract, left eye: Secondary | ICD-10-CM | POA: Diagnosis not present

## 2023-07-07 ENCOUNTER — Encounter (HOSPITAL_COMMUNITY)
Admission: RE | Admit: 2023-07-07 | Discharge: 2023-07-07 | Disposition: A | Discharge status: Home / Self Care | Payer: Medicare HMO | Source: Ambulatory Visit | Attending: Optometry | Admitting: Optometry

## 2023-07-10 ENCOUNTER — Encounter (HOSPITAL_COMMUNITY): Payer: Self-pay | Admitting: Optometry

## 2023-07-10 ENCOUNTER — Ambulatory Visit (HOSPITAL_COMMUNITY)
Admission: RE | Admit: 2023-07-10 | Discharge: 2023-07-10 | Disposition: A | Discharge status: Home / Self Care | Payer: Medicare HMO | Attending: Optometry | Admitting: Optometry

## 2023-07-10 ENCOUNTER — Encounter (HOSPITAL_COMMUNITY)
Admission: RE | Disposition: A | Discharge status: Home / Self Care | Payer: Self-pay | Source: Home / Self Care | Attending: Optometry

## 2023-07-10 ENCOUNTER — Ambulatory Visit (HOSPITAL_COMMUNITY): Payer: Medicare HMO | Admitting: Certified Registered"

## 2023-07-10 ENCOUNTER — Ambulatory Visit (HOSPITAL_BASED_OUTPATIENT_CLINIC_OR_DEPARTMENT_OTHER): Payer: Medicare HMO | Admitting: Certified Registered"

## 2023-07-10 DIAGNOSIS — J449 Chronic obstructive pulmonary disease, unspecified: Secondary | ICD-10-CM | POA: Diagnosis not present

## 2023-07-10 DIAGNOSIS — Z87891 Personal history of nicotine dependence: Secondary | ICD-10-CM | POA: Diagnosis not present

## 2023-07-10 DIAGNOSIS — I10 Essential (primary) hypertension: Secondary | ICD-10-CM | POA: Insufficient documentation

## 2023-07-10 DIAGNOSIS — I2510 Atherosclerotic heart disease of native coronary artery without angina pectoris: Secondary | ICD-10-CM | POA: Diagnosis not present

## 2023-07-10 DIAGNOSIS — H5212 Myopia, left eye: Secondary | ICD-10-CM | POA: Diagnosis not present

## 2023-07-10 DIAGNOSIS — Z7984 Long term (current) use of oral hypoglycemic drugs: Secondary | ICD-10-CM | POA: Diagnosis not present

## 2023-07-10 DIAGNOSIS — Z794 Long term (current) use of insulin: Secondary | ICD-10-CM | POA: Insufficient documentation

## 2023-07-10 DIAGNOSIS — H25812 Combined forms of age-related cataract, left eye: Secondary | ICD-10-CM | POA: Insufficient documentation

## 2023-07-10 DIAGNOSIS — I5043 Acute on chronic combined systolic (congestive) and diastolic (congestive) heart failure: Secondary | ICD-10-CM | POA: Diagnosis not present

## 2023-07-10 DIAGNOSIS — Z961 Presence of intraocular lens: Secondary | ICD-10-CM | POA: Diagnosis not present

## 2023-07-10 DIAGNOSIS — E1136 Type 2 diabetes mellitus with diabetic cataract: Secondary | ICD-10-CM | POA: Diagnosis not present

## 2023-07-10 DIAGNOSIS — I110 Hypertensive heart disease with heart failure: Secondary | ICD-10-CM

## 2023-07-10 DIAGNOSIS — Z8673 Personal history of transient ischemic attack (TIA), and cerebral infarction without residual deficits: Secondary | ICD-10-CM | POA: Diagnosis not present

## 2023-07-10 DIAGNOSIS — F17210 Nicotine dependence, cigarettes, uncomplicated: Secondary | ICD-10-CM

## 2023-07-10 DIAGNOSIS — Z9841 Cataract extraction status, right eye: Secondary | ICD-10-CM | POA: Insufficient documentation

## 2023-07-10 DIAGNOSIS — H2512 Age-related nuclear cataract, left eye: Secondary | ICD-10-CM | POA: Diagnosis not present

## 2023-07-10 HISTORY — PX: CATARACT EXTRACTION W/PHACO: SHX586

## 2023-07-10 LAB — GLUCOSE, CAPILLARY: Glucose-Capillary: 128 mg/dL — ABNORMAL HIGH (ref 70–99)

## 2023-07-10 SURGERY — PHACOEMULSIFICATION, CATARACT, WITH IOL INSERTION
Anesthesia: Monitor Anesthesia Care | Site: Eye | Laterality: Left

## 2023-07-10 MED ORDER — LIDOCAINE HCL 3.5 % OP GEL
1.0000 | Freq: Once | OPHTHALMIC | Status: AC
  Administered 2023-07-10: 1 via OPHTHALMIC

## 2023-07-10 MED ORDER — FENTANYL CITRATE (PF) 100 MCG/2ML IJ SOLN
INTRAMUSCULAR | Status: AC
  Filled 2023-07-10: qty 2

## 2023-07-10 MED ORDER — MIDAZOLAM HCL 2 MG/2ML IJ SOLN
INTRAMUSCULAR | Status: DC | PRN
  Administered 2023-07-10: 1 mg via INTRAVENOUS

## 2023-07-10 MED ORDER — PHENYLEPHRINE-KETOROLAC 1-0.3 % IO SOLN
INTRAOCULAR | Status: DC | PRN
  Administered 2023-07-10: 500 mL via OPHTHALMIC

## 2023-07-10 MED ORDER — LIDOCAINE HCL (PF) 1 % IJ SOLN
INTRAMUSCULAR | Status: DC | PRN
  Administered 2023-07-10: 1 mL

## 2023-07-10 MED ORDER — BSS IO SOLN
INTRAOCULAR | Status: DC | PRN
  Administered 2023-07-10: 15 mL via INTRAOCULAR

## 2023-07-10 MED ORDER — SIGHTPATH DOSE#1 NA HYALUR & NA CHOND-NA HYALUR IO KIT
PACK | INTRAOCULAR | Status: DC | PRN
  Administered 2023-07-10: 1 via OPHTHALMIC

## 2023-07-10 MED ORDER — PHENYLEPHRINE HCL 2.5 % OP SOLN
1.0000 [drp] | OPHTHALMIC | Status: AC | PRN
  Administered 2023-07-10 (×3): 1 [drp] via OPHTHALMIC

## 2023-07-10 MED ORDER — STERILE WATER FOR IRRIGATION IR SOLN
Status: DC | PRN
  Administered 2023-07-10: 1

## 2023-07-10 MED ORDER — POVIDONE-IODINE 5 % OP SOLN
OPHTHALMIC | Status: DC | PRN
  Administered 2023-07-10: 1 via OPHTHALMIC

## 2023-07-10 MED ORDER — NEOMYCIN-POLYMYXIN-DEXAMETH 3.5-10000-0.1 OP SUSP
OPHTHALMIC | Status: DC | PRN
  Administered 2023-07-10: 2 [drp] via OPHTHALMIC

## 2023-07-10 MED ORDER — TROPICAMIDE 1 % OP SOLN
1.0000 [drp] | OPHTHALMIC | Status: AC | PRN
  Administered 2023-07-10 (×3): 1 [drp] via OPHTHALMIC

## 2023-07-10 MED ORDER — MIDAZOLAM HCL 2 MG/2ML IJ SOLN
INTRAMUSCULAR | Status: AC
  Filled 2023-07-10: qty 2

## 2023-07-10 MED ORDER — FENTANYL CITRATE (PF) 100 MCG/2ML IJ SOLN
INTRAMUSCULAR | Status: DC | PRN
  Administered 2023-07-10: 50 ug via INTRAVENOUS

## 2023-07-10 MED ORDER — TETRACAINE HCL 0.5 % OP SOLN
1.0000 [drp] | OPHTHALMIC | Status: AC | PRN
  Administered 2023-07-10 (×3): 1 [drp] via OPHTHALMIC

## 2023-07-10 MED ORDER — SODIUM CHLORIDE 0.9% FLUSH
INTRAVENOUS | Status: DC | PRN
  Administered 2023-07-10: 10 mL via INTRAVENOUS

## 2023-07-10 SURGICAL SUPPLY — 14 items
CATARACT SUITE SIGHTPATH (MISCELLANEOUS) ×1 IMPLANT
CLOTH BEACON ORANGE TIMEOUT ST (SAFETY) ×1 IMPLANT
DRSG TEGADERM 4X4.75 (GAUZE/BANDAGES/DRESSINGS) ×1 IMPLANT
EYE SHIELD UNIVERSAL CLEAR (GAUZE/BANDAGES/DRESSINGS) IMPLANT
FEE CATARACT SUITE SIGHTPATH (MISCELLANEOUS) ×1 IMPLANT
GLOVE BIOGEL PI IND STRL 7.0 (GLOVE) ×2 IMPLANT
LENS IOL TECNIS EYHANCE 21.0 (Intraocular Lens) IMPLANT
NDL HYPO 18GX1.5 BLUNT FILL (NEEDLE) ×1 IMPLANT
NEEDLE HYPO 18GX1.5 BLUNT FILL (NEEDLE) ×1 IMPLANT
PAD ARMBOARD 7.5X6 YLW CONV (MISCELLANEOUS) ×1 IMPLANT
POSITIONER HEAD 8X9X4 ADT (SOFTGOODS) ×1 IMPLANT
SYR TB 1ML LL NO SAFETY (SYRINGE) ×1 IMPLANT
TAPE SURG TRANSPORE 1 IN (GAUZE/BANDAGES/DRESSINGS) IMPLANT
WATER STERILE IRR 250ML POUR (IV SOLUTION) ×1 IMPLANT

## 2023-07-10 NOTE — Op Note (Signed)
Date of procedure: 07/10/23  Pre-operative diagnosis: Visually significant age-related nuclear cataract, Left Eye (H25.12)  Post-operative diagnosis: Visually significant age-related nuclear cataract, Left Eye  Procedure: Removal of cataract via phacoemulsification and insertion of intra-ocular lens J&J DIB00 +21.0D into the capsular bag of the Left Eye  Attending surgeon: Ronal Fear, MD  Anesthesia: MAC, Topical Akten  Complications: None  Estimated Blood Loss: <67mL (minimal)  Specimens: None  Implants:  Implant Name Type Inv. Item Serial No. Manufacturer Lot No. LRB No. Used Action  LENS IOL TECNIS EYHANCE 21.0 - [id number] Intraocular Lens LENS IOL TECNIS EYHANCE 21.0 [id number] [employer]  Left 1 Implanted    Indications:  Visually significant age-related cataract, Left Eye  Procedure:  The patient was seen and identified in the pre-operative area. The operative eye was identified and dilated.  The operative eye was marked.  Topical anesthesia was administered to the operative eye.     The patient was then to the operative suite and placed in the supine position.  A timeout was performed confirming the patient, procedure to be performed, and all other relevant information.   The patient's face was prepped and draped in the usual fashion for intra-ocular surgery.  A lid speculum was placed into the operative eye and the surgical microscope moved into place and focused.  An inferotemporal paracentesis was created using a 20 gauge paracentesis blade.  BSS mixed with Omidria, followed by 1% lidocaine was injected into the anterior chamber.  Viscoelastic was injected into the anterior chamber.  A temporal clear-corneal main wound incision was created using a 2.58mm microkeratome.  A continuous curvilinear capsulorrhexis was initiated using an irrigating cystitome and completed using capsulorrhexis forceps.  Hydrodissection and hydrodeliniation were performed.  Viscoelastic was  injected into the anterior chamber.  A phacoemulsification handpiece and a chopper as a second instrument were used to remove the nucleus and epinucleus. The irrigation/aspiration handpiece was used to remove any remaining cortical material.   The capsular bag was reinflated with viscoelastic, checked, and found to be intact.  The intraocular lens was inserted into the capsular bag.  The irrigation/aspiration handpiece was used to remove any remaining viscoelastic.  The clear corneal wound and paracentesis wounds were then hydrated and checked with Weck-Cels to be watertight.  The lid-speculum and drape was removed, and the patient's face was cleaned with a wet and dry 4x4.  Maxitrol drops were instilled onto the eye. A clear shield was taped over the eye. The patient was taken to the post-operative care unit in good condition, having tolerated the procedure well.  Post-Op Instructions: The patient will follow up at [facility] for a same day post-operative evaluation and will receive all other orders and instructions.

## 2023-07-10 NOTE — Anesthesia Preprocedure Evaluation (Signed)
Anesthesia Evaluation  Patient identified by MRN, date of birth, ID band Patient awake    Reviewed: Allergy & Precautions, H&P , NPO status , Patient's Chart, lab work & pertinent test results, reviewed documented beta blocker date and time   Airway Mallampati: II  TM Distance: >3 FB Neck ROM: full    Dental no notable dental hx.    Pulmonary neg pulmonary ROS, COPD, former smoker   Pulmonary exam normal breath sounds clear to auscultation       Cardiovascular Exercise Tolerance: Good hypertension, + CAD  negative cardio ROS + dysrhythmias  Rhythm:regular Rate:Normal     Neuro/Psych CVA negative neurological ROS  negative psych ROS   GI/Hepatic negative GI ROS, Neg liver ROS,,,  Endo/Other  negative endocrine ROSdiabetes    Renal/GU negative Renal ROS  negative genitourinary   Musculoskeletal   Abdominal   Peds  Hematology negative hematology ROS (+)   Anesthesia Other Findings   Reproductive/Obstetrics negative OB ROS                             Anesthesia Physical Anesthesia Plan  ASA: 3  Anesthesia Plan: MAC   Post-op Pain Management:    Induction:   PONV Risk Score and Plan:   Airway Management Planned:   Additional Equipment:   Intra-op Plan:   Post-operative Plan:   Informed Consent: I have reviewed the patients History and Physical, chart, labs and discussed the procedure including the risks, benefits and alternatives for the proposed anesthesia with the patient or authorized representative who has indicated his/her understanding and acceptance.     Dental Advisory Given  Plan Discussed with: CRNA  Anesthesia Plan Comments:        Anesthesia Quick Evaluation

## 2023-07-10 NOTE — Transfer of Care (Addendum)
Immediate Anesthesia Transfer of Care Note  Patient: Jon Warren  Procedure(s) Performed: CATARACT EXTRACTION PHACO AND INTRAOCULAR LENS PLACEMENT (IOC) (Left: Eye)  Patient Location: PACU and Short Stay  Anesthesia Type:MAC  Level of Consciousness: awake, alert , and patient cooperative  Airway & Oxygen Therapy: Patient Spontanous Breathing  Post-op Assessment: Report given to RN and Post -op Vital signs reviewed and stable  Post vital signs: Reviewed and stable  Last Vitals:  Vitals Value Taken Time  BP 113/55 07/10/23   1256  Temp 36.4 07/10/23   1256  Pulse 58 07/10/23   1256  Resp 16 07/10/23   1256  SpO2 97% 07/10/23   1256    Last Pain:  Vitals:   07/10/23 1025  TempSrc: Oral  PainSc: 0-No pain      Patients Stated Pain Goal: 6 (07/10/23 1025)  Complications: No notable events documented.

## 2023-07-10 NOTE — Interval H&P Note (Signed)
History and Physical Interval Note:  07/10/2023 11:26 AM  The H and P was reviewed and updated. The patient was examined.  No changes were found after exam.  The surgical eye was marked.  Jon Warren

## 2023-07-10 NOTE — Discharge Instructions (Signed)
Please discharge patient when stable, will follow up today with Dr. Snyder at the [facility] [place] office immediately following discharge.  Leave shield in place until visit.  All paperwork with discharge instructions will be given at the office.  [facility] Address:  [address]  [place], [place] [address]  Dr. Snyder's Phone: [phone]  

## 2023-07-13 ENCOUNTER — Encounter (HOSPITAL_COMMUNITY): Payer: Self-pay | Admitting: Optometry

## 2023-07-13 NOTE — Anesthesia Postprocedure Evaluation (Signed)
Anesthesia Post Note  Patient: Jon Warren  Procedure(s) Performed: CATARACT EXTRACTION PHACO AND INTRAOCULAR LENS PLACEMENT (IOC) (Left: Eye)  Patient location during evaluation: Phase II Anesthesia Type: MAC Level of consciousness: awake Pain management: pain level controlled Vital Signs Assessment: post-procedure vital signs reviewed and stable Respiratory status: spontaneous breathing and respiratory function stable Cardiovascular status: blood pressure returned to baseline and stable Postop Assessment: no headache and no apparent nausea or vomiting Anesthetic complications: no Comments: Late entry   No notable events documented.   Last Vitals:  Vitals:   07/10/23 1025 07/10/23 1256  BP: 102/62 (!) 113/55  Pulse: 62 (!) 58  Resp: 18 16  Temp: (!) 36.3 C 36.4 C  SpO2: 93% 97%    Last Pain:  Vitals:   07/10/23 1256  TempSrc:   PainSc: 0-No pain                 Windell Norfolk

## 2023-09-22 ENCOUNTER — Other Ambulatory Visit: Payer: Self-pay | Admitting: Cardiology

## 2023-10-03 ENCOUNTER — Other Ambulatory Visit: Payer: Self-pay | Admitting: Cardiology

## 2023-10-26 ENCOUNTER — Other Ambulatory Visit: Payer: Self-pay | Admitting: Cardiology

## 2023-11-25 DIAGNOSIS — Z1321 Encounter for screening for nutritional disorder: Secondary | ICD-10-CM | POA: Diagnosis not present

## 2023-11-25 DIAGNOSIS — G4730 Sleep apnea, unspecified: Secondary | ICD-10-CM | POA: Diagnosis not present

## 2023-11-25 DIAGNOSIS — E7849 Other hyperlipidemia: Secondary | ICD-10-CM | POA: Diagnosis not present

## 2023-11-25 DIAGNOSIS — E559 Vitamin D deficiency, unspecified: Secondary | ICD-10-CM | POA: Diagnosis not present

## 2023-11-25 DIAGNOSIS — E782 Mixed hyperlipidemia: Secondary | ICD-10-CM | POA: Diagnosis not present

## 2023-11-25 DIAGNOSIS — E1165 Type 2 diabetes mellitus with hyperglycemia: Secondary | ICD-10-CM | POA: Diagnosis not present

## 2023-11-25 DIAGNOSIS — E875 Hyperkalemia: Secondary | ICD-10-CM | POA: Diagnosis not present

## 2023-11-25 DIAGNOSIS — J449 Chronic obstructive pulmonary disease, unspecified: Secondary | ICD-10-CM | POA: Diagnosis not present

## 2023-11-25 DIAGNOSIS — Z1329 Encounter for screening for other suspected endocrine disorder: Secondary | ICD-10-CM | POA: Diagnosis not present

## 2023-11-25 DIAGNOSIS — Z8673 Personal history of transient ischemic attack (TIA), and cerebral infarction without residual deficits: Secondary | ICD-10-CM | POA: Diagnosis not present

## 2023-12-04 DIAGNOSIS — I10 Essential (primary) hypertension: Secondary | ICD-10-CM | POA: Diagnosis not present

## 2023-12-04 DIAGNOSIS — G4733 Obstructive sleep apnea (adult) (pediatric): Secondary | ICD-10-CM | POA: Diagnosis not present

## 2023-12-04 DIAGNOSIS — E875 Hyperkalemia: Secondary | ICD-10-CM | POA: Diagnosis not present

## 2023-12-04 DIAGNOSIS — J449 Chronic obstructive pulmonary disease, unspecified: Secondary | ICD-10-CM | POA: Diagnosis not present

## 2023-12-04 DIAGNOSIS — N400 Enlarged prostate without lower urinary tract symptoms: Secondary | ICD-10-CM | POA: Diagnosis not present

## 2023-12-04 DIAGNOSIS — E1165 Type 2 diabetes mellitus with hyperglycemia: Secondary | ICD-10-CM | POA: Diagnosis not present

## 2023-12-04 DIAGNOSIS — E7849 Other hyperlipidemia: Secondary | ICD-10-CM | POA: Diagnosis not present

## 2023-12-04 DIAGNOSIS — E1140 Type 2 diabetes mellitus with diabetic neuropathy, unspecified: Secondary | ICD-10-CM | POA: Diagnosis not present

## 2023-12-17 ENCOUNTER — Encounter (INDEPENDENT_AMBULATORY_CARE_PROVIDER_SITE_OTHER): Payer: Self-pay | Admitting: *Deleted

## 2024-02-17 DIAGNOSIS — E083291 Diabetes mellitus due to underlying condition with mild nonproliferative diabetic retinopathy without macular edema, right eye: Secondary | ICD-10-CM | POA: Diagnosis not present

## 2024-02-17 DIAGNOSIS — E113291 Type 2 diabetes mellitus with mild nonproliferative diabetic retinopathy without macular edema, right eye: Secondary | ICD-10-CM | POA: Diagnosis not present

## 2024-02-17 DIAGNOSIS — Z7984 Long term (current) use of oral hypoglycemic drugs: Secondary | ICD-10-CM | POA: Diagnosis not present

## 2024-03-02 DIAGNOSIS — E875 Hyperkalemia: Secondary | ICD-10-CM | POA: Diagnosis not present

## 2024-03-02 DIAGNOSIS — E1165 Type 2 diabetes mellitus with hyperglycemia: Secondary | ICD-10-CM | POA: Diagnosis not present

## 2024-03-02 DIAGNOSIS — I10 Essential (primary) hypertension: Secondary | ICD-10-CM | POA: Diagnosis not present

## 2024-03-02 DIAGNOSIS — E7849 Other hyperlipidemia: Secondary | ICD-10-CM | POA: Diagnosis not present

## 2024-03-02 DIAGNOSIS — Z0000 Encounter for general adult medical examination without abnormal findings: Secondary | ICD-10-CM | POA: Diagnosis not present

## 2024-03-09 DIAGNOSIS — Z0000 Encounter for general adult medical examination without abnormal findings: Secondary | ICD-10-CM | POA: Diagnosis not present

## 2024-03-09 DIAGNOSIS — J449 Chronic obstructive pulmonary disease, unspecified: Secondary | ICD-10-CM | POA: Diagnosis not present

## 2024-03-09 DIAGNOSIS — Z6836 Body mass index (BMI) 36.0-36.9, adult: Secondary | ICD-10-CM | POA: Diagnosis not present

## 2024-03-09 DIAGNOSIS — E1165 Type 2 diabetes mellitus with hyperglycemia: Secondary | ICD-10-CM | POA: Diagnosis not present

## 2024-03-09 DIAGNOSIS — E785 Hyperlipidemia, unspecified: Secondary | ICD-10-CM | POA: Diagnosis not present

## 2024-03-28 DIAGNOSIS — Z1211 Encounter for screening for malignant neoplasm of colon: Secondary | ICD-10-CM | POA: Diagnosis not present

## 2024-03-28 DIAGNOSIS — Z1212 Encounter for screening for malignant neoplasm of rectum: Secondary | ICD-10-CM | POA: Diagnosis not present

## 2024-05-19 DIAGNOSIS — E113291 Type 2 diabetes mellitus with mild nonproliferative diabetic retinopathy without macular edema, right eye: Secondary | ICD-10-CM | POA: Diagnosis not present

## 2024-06-15 ENCOUNTER — Encounter (INDEPENDENT_AMBULATORY_CARE_PROVIDER_SITE_OTHER): Payer: Self-pay | Admitting: *Deleted

## 2024-06-20 DIAGNOSIS — E1165 Type 2 diabetes mellitus with hyperglycemia: Secondary | ICD-10-CM | POA: Diagnosis not present

## 2024-06-20 DIAGNOSIS — Z6836 Body mass index (BMI) 36.0-36.9, adult: Secondary | ICD-10-CM | POA: Diagnosis not present

## 2024-06-20 DIAGNOSIS — I10 Essential (primary) hypertension: Secondary | ICD-10-CM | POA: Diagnosis not present

## 2024-06-20 DIAGNOSIS — E11621 Type 2 diabetes mellitus with foot ulcer: Secondary | ICD-10-CM | POA: Diagnosis not present

## 2024-06-20 DIAGNOSIS — E785 Hyperlipidemia, unspecified: Secondary | ICD-10-CM | POA: Diagnosis not present

## 2024-06-20 DIAGNOSIS — J449 Chronic obstructive pulmonary disease, unspecified: Secondary | ICD-10-CM | POA: Diagnosis not present

## 2024-07-08 DIAGNOSIS — I447 Left bundle-branch block, unspecified: Secondary | ICD-10-CM | POA: Diagnosis not present

## 2024-07-08 DIAGNOSIS — R0602 Shortness of breath: Secondary | ICD-10-CM | POA: Diagnosis not present

## 2024-07-08 DIAGNOSIS — Z20822 Contact with and (suspected) exposure to covid-19: Secondary | ICD-10-CM | POA: Diagnosis not present

## 2024-07-08 DIAGNOSIS — I10 Essential (primary) hypertension: Secondary | ICD-10-CM | POA: Diagnosis not present

## 2024-07-08 DIAGNOSIS — J811 Chronic pulmonary edema: Secondary | ICD-10-CM | POA: Diagnosis not present

## 2024-07-08 DIAGNOSIS — R079 Chest pain, unspecified: Secondary | ICD-10-CM | POA: Diagnosis not present

## 2024-07-08 DIAGNOSIS — J90 Pleural effusion, not elsewhere classified: Secondary | ICD-10-CM | POA: Diagnosis not present

## 2024-07-08 DIAGNOSIS — R071 Chest pain on breathing: Secondary | ICD-10-CM | POA: Diagnosis not present

## 2024-07-08 DIAGNOSIS — E785 Hyperlipidemia, unspecified: Secondary | ICD-10-CM | POA: Diagnosis not present

## 2024-07-08 DIAGNOSIS — I2129 ST elevation (STEMI) myocardial infarction involving other sites: Secondary | ICD-10-CM | POA: Diagnosis not present

## 2024-07-08 DIAGNOSIS — I509 Heart failure, unspecified: Secondary | ICD-10-CM | POA: Diagnosis not present

## 2024-07-08 DIAGNOSIS — R918 Other nonspecific abnormal finding of lung field: Secondary | ICD-10-CM | POA: Diagnosis not present

## 2024-07-08 DIAGNOSIS — I110 Hypertensive heart disease with heart failure: Secondary | ICD-10-CM | POA: Diagnosis not present

## 2024-07-12 DIAGNOSIS — I10 Essential (primary) hypertension: Secondary | ICD-10-CM | POA: Diagnosis not present

## 2024-07-12 DIAGNOSIS — G629 Polyneuropathy, unspecified: Secondary | ICD-10-CM | POA: Diagnosis not present

## 2024-07-12 DIAGNOSIS — I447 Left bundle-branch block, unspecified: Secondary | ICD-10-CM | POA: Diagnosis not present

## 2024-07-12 DIAGNOSIS — L97419 Non-pressure chronic ulcer of right heel and midfoot with unspecified severity: Secondary | ICD-10-CM | POA: Diagnosis not present

## 2024-07-12 DIAGNOSIS — G4733 Obstructive sleep apnea (adult) (pediatric): Secondary | ICD-10-CM | POA: Diagnosis not present

## 2024-07-12 DIAGNOSIS — E11621 Type 2 diabetes mellitus with foot ulcer: Secondary | ICD-10-CM | POA: Diagnosis not present

## 2024-07-12 DIAGNOSIS — L97409 Non-pressure chronic ulcer of unspecified heel and midfoot with unspecified severity: Secondary | ICD-10-CM | POA: Diagnosis not present

## 2024-07-19 DIAGNOSIS — L97419 Non-pressure chronic ulcer of right heel and midfoot with unspecified severity: Secondary | ICD-10-CM | POA: Diagnosis not present

## 2024-07-19 DIAGNOSIS — M7989 Other specified soft tissue disorders: Secondary | ICD-10-CM | POA: Diagnosis not present

## 2024-07-19 DIAGNOSIS — Z6836 Body mass index (BMI) 36.0-36.9, adult: Secondary | ICD-10-CM | POA: Diagnosis not present

## 2024-07-19 DIAGNOSIS — E785 Hyperlipidemia, unspecified: Secondary | ICD-10-CM | POA: Diagnosis not present

## 2024-07-19 DIAGNOSIS — E11621 Type 2 diabetes mellitus with foot ulcer: Secondary | ICD-10-CM | POA: Diagnosis not present

## 2024-07-19 DIAGNOSIS — I10 Essential (primary) hypertension: Secondary | ICD-10-CM | POA: Diagnosis not present

## 2024-07-19 DIAGNOSIS — L97409 Non-pressure chronic ulcer of unspecified heel and midfoot with unspecified severity: Secondary | ICD-10-CM | POA: Diagnosis not present

## 2024-07-19 DIAGNOSIS — J449 Chronic obstructive pulmonary disease, unspecified: Secondary | ICD-10-CM | POA: Diagnosis not present

## 2024-07-19 DIAGNOSIS — E1165 Type 2 diabetes mellitus with hyperglycemia: Secondary | ICD-10-CM | POA: Diagnosis not present

## 2024-07-22 DIAGNOSIS — L97419 Non-pressure chronic ulcer of right heel and midfoot with unspecified severity: Secondary | ICD-10-CM | POA: Diagnosis not present

## 2024-07-22 DIAGNOSIS — E11621 Type 2 diabetes mellitus with foot ulcer: Secondary | ICD-10-CM | POA: Diagnosis not present

## 2024-07-22 DIAGNOSIS — L97409 Non-pressure chronic ulcer of unspecified heel and midfoot with unspecified severity: Secondary | ICD-10-CM | POA: Diagnosis not present

## 2024-07-26 DIAGNOSIS — I447 Left bundle-branch block, unspecified: Secondary | ICD-10-CM | POA: Diagnosis not present

## 2024-07-26 DIAGNOSIS — E11621 Type 2 diabetes mellitus with foot ulcer: Secondary | ICD-10-CM | POA: Diagnosis not present

## 2024-07-26 DIAGNOSIS — L97419 Non-pressure chronic ulcer of right heel and midfoot with unspecified severity: Secondary | ICD-10-CM | POA: Diagnosis not present

## 2024-07-26 DIAGNOSIS — L97409 Non-pressure chronic ulcer of unspecified heel and midfoot with unspecified severity: Secondary | ICD-10-CM | POA: Diagnosis not present

## 2024-07-26 DIAGNOSIS — G4733 Obstructive sleep apnea (adult) (pediatric): Secondary | ICD-10-CM | POA: Diagnosis not present

## 2024-07-26 DIAGNOSIS — I10 Essential (primary) hypertension: Secondary | ICD-10-CM | POA: Diagnosis not present

## 2024-07-26 DIAGNOSIS — G629 Polyneuropathy, unspecified: Secondary | ICD-10-CM | POA: Diagnosis not present

## 2024-08-02 DIAGNOSIS — L97409 Non-pressure chronic ulcer of unspecified heel and midfoot with unspecified severity: Secondary | ICD-10-CM | POA: Diagnosis not present

## 2024-08-02 DIAGNOSIS — I447 Left bundle-branch block, unspecified: Secondary | ICD-10-CM | POA: Diagnosis not present

## 2024-08-02 DIAGNOSIS — G629 Polyneuropathy, unspecified: Secondary | ICD-10-CM | POA: Diagnosis not present

## 2024-08-02 DIAGNOSIS — L97419 Non-pressure chronic ulcer of right heel and midfoot with unspecified severity: Secondary | ICD-10-CM | POA: Diagnosis not present

## 2024-08-02 DIAGNOSIS — I10 Essential (primary) hypertension: Secondary | ICD-10-CM | POA: Diagnosis not present

## 2024-08-02 DIAGNOSIS — E11621 Type 2 diabetes mellitus with foot ulcer: Secondary | ICD-10-CM | POA: Diagnosis not present

## 2024-08-02 DIAGNOSIS — G4733 Obstructive sleep apnea (adult) (pediatric): Secondary | ICD-10-CM | POA: Diagnosis not present

## 2024-08-12 DIAGNOSIS — E11621 Type 2 diabetes mellitus with foot ulcer: Secondary | ICD-10-CM | POA: Diagnosis not present

## 2024-08-19 DIAGNOSIS — E785 Hyperlipidemia, unspecified: Secondary | ICD-10-CM | POA: Diagnosis not present

## 2024-08-23 DIAGNOSIS — L97409 Non-pressure chronic ulcer of unspecified heel and midfoot with unspecified severity: Secondary | ICD-10-CM | POA: Diagnosis not present

## 2024-08-23 DIAGNOSIS — E11621 Type 2 diabetes mellitus with foot ulcer: Secondary | ICD-10-CM | POA: Diagnosis not present

## 2024-09-06 DIAGNOSIS — L97409 Non-pressure chronic ulcer of unspecified heel and midfoot with unspecified severity: Secondary | ICD-10-CM | POA: Diagnosis not present

## 2024-09-06 DIAGNOSIS — E11621 Type 2 diabetes mellitus with foot ulcer: Secondary | ICD-10-CM | POA: Diagnosis not present

## 2024-09-20 DIAGNOSIS — E11621 Type 2 diabetes mellitus with foot ulcer: Secondary | ICD-10-CM | POA: Diagnosis not present

## 2024-09-20 DIAGNOSIS — L97409 Non-pressure chronic ulcer of unspecified heel and midfoot with unspecified severity: Secondary | ICD-10-CM | POA: Diagnosis not present

## 2024-09-20 DIAGNOSIS — L97419 Non-pressure chronic ulcer of right heel and midfoot with unspecified severity: Secondary | ICD-10-CM | POA: Diagnosis not present

## 2024-09-27 DIAGNOSIS — L97409 Non-pressure chronic ulcer of unspecified heel and midfoot with unspecified severity: Secondary | ICD-10-CM | POA: Diagnosis not present

## 2024-09-27 DIAGNOSIS — L97419 Non-pressure chronic ulcer of right heel and midfoot with unspecified severity: Secondary | ICD-10-CM | POA: Diagnosis not present

## 2024-09-27 DIAGNOSIS — E11621 Type 2 diabetes mellitus with foot ulcer: Secondary | ICD-10-CM | POA: Diagnosis not present

## 2024-10-11 DIAGNOSIS — E11621 Type 2 diabetes mellitus with foot ulcer: Secondary | ICD-10-CM | POA: Diagnosis not present

## 2024-10-11 DIAGNOSIS — L97409 Non-pressure chronic ulcer of unspecified heel and midfoot with unspecified severity: Secondary | ICD-10-CM | POA: Diagnosis not present

## 2024-10-12 DIAGNOSIS — I5022 Chronic systolic (congestive) heart failure: Secondary | ICD-10-CM | POA: Diagnosis not present

## 2024-10-12 DIAGNOSIS — Z1331 Encounter for screening for depression: Secondary | ICD-10-CM | POA: Diagnosis not present

## 2024-10-12 DIAGNOSIS — J449 Chronic obstructive pulmonary disease, unspecified: Secondary | ICD-10-CM | POA: Diagnosis not present

## 2024-10-12 DIAGNOSIS — E1165 Type 2 diabetes mellitus with hyperglycemia: Secondary | ICD-10-CM | POA: Diagnosis not present

## 2024-10-12 DIAGNOSIS — E1142 Type 2 diabetes mellitus with diabetic polyneuropathy: Secondary | ICD-10-CM | POA: Diagnosis not present

## 2024-10-12 DIAGNOSIS — E11621 Type 2 diabetes mellitus with foot ulcer: Secondary | ICD-10-CM | POA: Diagnosis not present

## 2024-10-12 DIAGNOSIS — Z23 Encounter for immunization: Secondary | ICD-10-CM | POA: Diagnosis not present

## 2024-10-12 DIAGNOSIS — Z0001 Encounter for general adult medical examination with abnormal findings: Secondary | ICD-10-CM | POA: Diagnosis not present

## 2024-10-12 DIAGNOSIS — I10 Essential (primary) hypertension: Secondary | ICD-10-CM | POA: Diagnosis not present

## 2024-10-29 DIAGNOSIS — J441 Chronic obstructive pulmonary disease with (acute) exacerbation: Secondary | ICD-10-CM | POA: Diagnosis not present

## 2024-11-01 DIAGNOSIS — L97409 Non-pressure chronic ulcer of unspecified heel and midfoot with unspecified severity: Secondary | ICD-10-CM | POA: Diagnosis not present

## 2024-11-01 DIAGNOSIS — E11621 Type 2 diabetes mellitus with foot ulcer: Secondary | ICD-10-CM | POA: Diagnosis not present

## 2024-11-21 DIAGNOSIS — E113291 Type 2 diabetes mellitus with mild nonproliferative diabetic retinopathy without macular edema, right eye: Secondary | ICD-10-CM | POA: Diagnosis not present
